# Patient Record
Sex: Female | Born: 1975 | Race: White | Hispanic: No | Marital: Married | Smoking: Current every day smoker
Health system: Southern US, Community
[De-identification: ages and names within clinical notes are randomized; demographics above are authoritative.]

## PROBLEM LIST (undated history)

## (undated) DIAGNOSIS — F32A Depression, unspecified: Secondary | ICD-10-CM

## (undated) DIAGNOSIS — O24419 Gestational diabetes mellitus in pregnancy, unspecified control: Secondary | ICD-10-CM

## (undated) DIAGNOSIS — G44229 Chronic tension-type headache, not intractable: Secondary | ICD-10-CM

## (undated) DIAGNOSIS — F329 Major depressive disorder, single episode, unspecified: Secondary | ICD-10-CM

## (undated) HISTORY — DX: Chronic tension-type headache, not intractable: G44.229

## (undated) HISTORY — DX: Gestational diabetes mellitus in pregnancy, unspecified control: O24.419

## (undated) HISTORY — DX: Depression, unspecified: F32.A

## (undated) HISTORY — PX: DILATION AND CURETTAGE OF UTERUS: SHX78

## (undated) HISTORY — DX: Major depressive disorder, single episode, unspecified: F32.9

---

## 1998-06-25 HISTORY — PX: EYE SURGERY: SHX253

## 2004-06-24 ENCOUNTER — Emergency Department (HOSPITAL_COMMUNITY): Admission: EM | Admit: 2004-06-24 | Discharge: 2004-06-24 | Payer: Self-pay | Admitting: Emergency Medicine

## 2004-06-26 ENCOUNTER — Emergency Department (HOSPITAL_COMMUNITY): Admission: EM | Admit: 2004-06-26 | Discharge: 2004-06-26 | Payer: Self-pay | Admitting: Emergency Medicine

## 2004-10-25 ENCOUNTER — Ambulatory Visit (HOSPITAL_COMMUNITY): Admission: RE | Admit: 2004-10-25 | Discharge: 2004-10-25 | Payer: Self-pay | Admitting: Obstetrics & Gynecology

## 2005-01-05 ENCOUNTER — Ambulatory Visit (HOSPITAL_COMMUNITY): Admission: RE | Admit: 2005-01-05 | Discharge: 2005-01-05 | Payer: Self-pay | Admitting: Obstetrics & Gynecology

## 2005-02-24 ENCOUNTER — Emergency Department (HOSPITAL_COMMUNITY): Admission: EM | Admit: 2005-02-24 | Discharge: 2005-02-24 | Payer: Self-pay | Admitting: Emergency Medicine

## 2005-03-24 ENCOUNTER — Inpatient Hospital Stay (HOSPITAL_COMMUNITY): Admission: AD | Admit: 2005-03-24 | Discharge: 2005-03-24 | Payer: Self-pay | Admitting: Obstetrics

## 2005-03-25 ENCOUNTER — Inpatient Hospital Stay (HOSPITAL_COMMUNITY): Admission: AD | Admit: 2005-03-25 | Discharge: 2005-03-25 | Payer: Self-pay | Admitting: Obstetrics

## 2005-03-25 ENCOUNTER — Inpatient Hospital Stay (HOSPITAL_COMMUNITY): Admission: AD | Admit: 2005-03-25 | Discharge: 2005-03-28 | Payer: Self-pay | Admitting: Obstetrics

## 2005-03-27 ENCOUNTER — Encounter (INDEPENDENT_AMBULATORY_CARE_PROVIDER_SITE_OTHER): Payer: Self-pay | Admitting: Specialist

## 2006-08-04 ENCOUNTER — Emergency Department (HOSPITAL_COMMUNITY): Admission: EM | Admit: 2006-08-04 | Discharge: 2006-08-04 | Payer: Self-pay | Admitting: Emergency Medicine

## 2006-09-17 ENCOUNTER — Encounter: Admission: RE | Admit: 2006-09-17 | Discharge: 2006-09-17 | Payer: Self-pay | Admitting: Obstetrics & Gynecology

## 2006-09-21 ENCOUNTER — Inpatient Hospital Stay (HOSPITAL_COMMUNITY): Admission: AD | Admit: 2006-09-21 | Discharge: 2006-09-21 | Payer: Self-pay | Admitting: Obstetrics & Gynecology

## 2006-10-16 ENCOUNTER — Inpatient Hospital Stay (HOSPITAL_COMMUNITY): Admission: AD | Admit: 2006-10-16 | Discharge: 2006-10-19 | Payer: Self-pay | Admitting: Obstetrics and Gynecology

## 2007-12-18 ENCOUNTER — Emergency Department (HOSPITAL_COMMUNITY): Admission: EM | Admit: 2007-12-18 | Discharge: 2007-12-18 | Payer: Self-pay | Admitting: Emergency Medicine

## 2007-12-26 ENCOUNTER — Emergency Department (HOSPITAL_COMMUNITY): Admission: EM | Admit: 2007-12-26 | Discharge: 2007-12-26 | Payer: Self-pay | Admitting: Emergency Medicine

## 2008-01-13 ENCOUNTER — Emergency Department (HOSPITAL_COMMUNITY): Admission: EM | Admit: 2008-01-13 | Discharge: 2008-01-13 | Payer: Self-pay | Admitting: Emergency Medicine

## 2008-04-08 ENCOUNTER — Emergency Department (HOSPITAL_COMMUNITY): Admission: EM | Admit: 2008-04-08 | Discharge: 2008-04-09 | Payer: Self-pay | Admitting: Emergency Medicine

## 2010-10-08 ENCOUNTER — Emergency Department (HOSPITAL_COMMUNITY)
Admission: EM | Admit: 2010-10-08 | Discharge: 2010-10-08 | Disposition: A | Discharge status: Home / Self Care | Payer: Self-pay | Attending: Emergency Medicine | Admitting: Emergency Medicine

## 2010-10-08 DIAGNOSIS — R3915 Urgency of urination: Secondary | ICD-10-CM | POA: Insufficient documentation

## 2010-10-08 DIAGNOSIS — R350 Frequency of micturition: Secondary | ICD-10-CM | POA: Insufficient documentation

## 2010-10-08 DIAGNOSIS — N390 Urinary tract infection, site not specified: Secondary | ICD-10-CM | POA: Insufficient documentation

## 2010-10-08 DIAGNOSIS — R300 Dysuria: Secondary | ICD-10-CM | POA: Insufficient documentation

## 2010-10-08 LAB — URINALYSIS, ROUTINE W REFLEX MICROSCOPIC
Hgb urine dipstick: NEGATIVE
Ketones, ur: NEGATIVE mg/dL
Nitrite: NEGATIVE
Protein, ur: NEGATIVE mg/dL
Specific Gravity, Urine: 1.022 (ref 1.005–1.030)
Urobilinogen, UA: 1 mg/dL (ref 0.0–1.0)
pH: 8 (ref 5.0–8.0)

## 2010-10-08 LAB — URINE MICROSCOPIC-ADD ON

## 2010-10-08 LAB — POCT PREGNANCY, URINE
Preg Test, Ur: NEGATIVE
Preg Test, Ur: NEGATIVE

## 2010-11-10 NOTE — H&P (Signed)
[name], Tanya Ingram           ACCOUNT NO.:  [id number]   MEDICAL RECORD NO.:  [id number]          PATIENT TYPE:  INP   LOCATION:  9166                          FACILITY:  [facility]   PHYSICIAN:  Roseanna Rainbow, M.D.DATE OF BIRTH:  [birth year]   DATE OF ADMISSION:  03/25/2005  DATE OF DISCHARGE:                                HISTORY & PHYSICAL   CHIEF COMPLAINT:  The patient is a 35 year old para 0 with an estimated date  of confinement of September 29, with an intrauterine pregnancy at 40-2/7ths  weeks, complaining of uterine contractions.   HISTORY OF PRESENT ILLNESS:  Please see the above.   ANTEPARTUM COURSE:  No known risk factors. Labs: Antibody screen negative.  Blood type O positive. Chlamydia negative. GC negative. GBS negative on  September 1. Hepatitis B surface antigen negative. Hematocrit 36, hemoglobin  11.7. Pap smear within normal limits. Platelets 306,000. Rubella immune.  Urine culture and sensitivity no growth. RPR nonreactive.   PAST GYNECOLOGIC HISTORY:  She has a history of voluntary termination of  pregnancy first trimester. No sexually transmitted infections. Pap smear  normal.   PAST MEDICAL HISTORY:  1.  Recurrent urinary tract infections.  2.  Pyelonephritis.  3.  Migraine headaches.  4.  Anxiety.  5.  Depression.  6.  Questionable diagnosis of bipolar disorder.   PAST SURGICAL HISTORY:  1.  D&C.  2.  Laser eye surgery.   SOCIAL HISTORY:  Unemployed, single. Not using alcohol. Previously smoked  1/2 pack per day, recently quit. Previously used marijuana.   FAMILY HISTORY:  Breast cancer, alcoholism, depression, diabetes mellitus,  heart disease, migraine headaches, myocardial infarction, sociopathic  tendencies.   ALLERGIES:  SULFA.   MEDICATIONS:  Prenatal vitamins.   PHYSICAL EXAMINATION:  VITAL SIGNS:  Temperature 98, pulse 85, respirations  18, blood pressure 123/72, fetal heart tracing reassuring. Tocodynamometer:  Uterine  contractions every 3-5 minutes.  GENERAL:  Uncomfortable  PELVIC:  Sterile vaginal exam per the R.N. 6 cm dilated, presenting part  high.   ASSESSMENT:  1.  Intrauterine pregnancy at term.  2.  Latent versus early active labor.  3.  Fetal heart tracing consistent with fetal well being.   PLAN:  Admission, expectant management.      Roseanna Rainbow, M.D.  Electronically Signed     LAJ/MEDQ  D:  03/26/2005  T:  03/26/2005  Job:  [id number]

## 2010-11-10 NOTE — H&P (Signed)
Tanya Ingram, Tanya Ingram           ACCOUNT NO.:  [id number]   MEDICAL RECORD NO.:  [id number]          PATIENT TYPE:  INP   LOCATION:                                FACILITY:  [facility]   PHYSICIAN:  Duke Salvia. Marcelle Overlie, M.D.DATE OF BIRTH:  07-Sep-1975   DATE OF ADMISSION:  10/17/2006  DATE OF DISCHARGE:  10/19/2006                              HISTORY & PHYSICAL   CHIEF COMPLAINT:  Labor.   HISTORY OF PRESENT ILLNESS:  A 35 year old gravida 1, para 0, EDD October 16, 2006.  She presents in labor.  Her prenatal course has been  uneventful.  Her blood type is A-positive.  Her rubella titer is immune.  A three-hour GTT returned normal.  GBS was negative.   PAST MEDICAL HISTORY:  Please see the [facility] form for details.   PHYSICAL EXAMINATION:  VITAL SIGNS:  Temperature 98.2 degrees, blood  pressure 120/78.  HEENT:  Unremarkable.  NECK:  Supple without masses.  LUNGS:  Clear.  CARDIOVASCULAR:  A regular rate and rhythm without murmurs, rubs or  gallops noted.  BREASTS:  Not examined.  ABDOMEN:  Term fundal height.  Fetal heart rate 140.  PELVIC:  Cervix on admission was 3, 50%, vertex.  Membranes intact.  EXTREMITIES:  Unremarkable.  NEUROLOGIC:  Unremarkable.   IMPRESSION:  Term intrauterine pregnancy, labor.   PLAN:  Anticipated vaginal delivery.      Richard M. Marcelle Overlie, M.D.  Electronically Signed     RMH/MEDQ  D:  12/24/2006  T:  12/24/2006  Job:  [id number]

## 2010-11-10 NOTE — Op Note (Signed)
NAMETAELYR, JANTZ           ACCOUNT NO.:  [id number]   MEDICAL RECORD NO.:  [id number]          PATIENT TYPE:  INP   LOCATION:  9134                          [facility]ACILITY:  [facility]   PHYSICIAN:  Roseanna Rainbow, M.D.DATE OF BIRTH:  1976/01/22   DATE OF PROCEDURE:  03/26/2005  DATE OF DISCHARGE:                                 OPERATIVE REPORT   The patient was fully dilated and pushing.  After slow crowning and delivery  of the head, there was difficulty delivering the anterior shoulder.  The  head restituted to ROA.  At this point, suprapubic pressure and McRoberts  positioning were performed in attempts to dislodge the anterior shoulder;  however, these attempts were unsuccessful.  An attempt was made at a Rubin  maneuver.  A modified woodscrew maneuver was then performed.  This was then  followed by an attempt to deliver the  posterior arm.  These maneuvers were  then repeated; the posterior arm was then ultimately delivered.  The  remainder of the body was delivered.  The cord was clamped and cut.  The  neonatologist was called.  The infant was transferred to the warmer.  An  umbilical artery pH was 7.30.  The time from delivery of the head to the  delivery of the shoulders was approximately 4 minutes.  The placenta was  then delivered intact with a three-vessel cord.  There were multiple small  labial lacerations and vaginal lacerations that were repaired with  interrupted sutures of 2-0 and 3-0 Vicryl Rapide.  The estimated blood loss  was approximately 500 mL.  The mother and infant were in stable condition.      Roseanna Rainbow, M.D.  Electronically Signed     LAJ/MEDQ  D:  03/26/2005  T:  03/26/2005  Job:  [id number]

## 2011-03-16 ENCOUNTER — Emergency Department (HOSPITAL_BASED_OUTPATIENT_CLINIC_OR_DEPARTMENT_OTHER)
Admission: EM | Admit: 2011-03-16 | Discharge: 2011-03-16 | Disposition: A | Discharge status: Home / Self Care | Payer: Self-pay | Attending: Emergency Medicine | Admitting: Emergency Medicine

## 2011-03-16 DIAGNOSIS — T622X1A Toxic effect of other ingested (parts of) plant(s), accidental (unintentional), initial encounter: Secondary | ICD-10-CM | POA: Insufficient documentation

## 2011-03-16 DIAGNOSIS — L237 Allergic contact dermatitis due to plants, except food: Secondary | ICD-10-CM

## 2011-03-16 DIAGNOSIS — F17200 Nicotine dependence, unspecified, uncomplicated: Secondary | ICD-10-CM | POA: Insufficient documentation

## 2011-03-16 DIAGNOSIS — L255 Unspecified contact dermatitis due to plants, except food: Secondary | ICD-10-CM | POA: Insufficient documentation

## 2011-03-16 MED ORDER — PREDNISONE 10 MG PO TABS: ORAL_TABLET | ORAL | Status: DC

## 2011-03-16 NOTE — ED Provider Notes (Signed)
History     CSN: [id number] Arrival date & time: 03/16/2011 12:43 PM  Chief Complaint  Patient presents with  . Poison Ivy    HPI  (Consider location/radiation/quality/duration/timing/severity/associated sxs/prior treatment)  Patient is a 35 y.o. female presenting with Poison Ivy. The history is provided by the patient. No language interpreter was used.  Poison Lajoyce Corners This is a new problem. The current episode started in the past 7 days. The problem occurs constantly. The problem has been unchanged. Associated symptoms include a rash. The symptoms are aggravated by nothing. She has tried nothing for the symptoms. The treatment provided moderate relief.    History reviewed. No pertinent past medical history.  Past Surgical History  Procedure Date  . Dilation and curettage of uterus     No family history on file.  History  Substance Use Topics  . Smoking status: Current Everyday Smoker  . Smokeless tobacco: Not on file  . Alcohol Use: Yes    OB History    Grav Para Term Preterm Abortions TAB SAB Ect Mult Living                  Review of Systems  Review of Systems  Skin: Positive for rash.  All other systems reviewed and are negative.    Allergies  Percocet  Home Medications  No current outpatient prescriptions on file.  Physical Exam    BP 107/67  Pulse 67  Temp(Src) 98.3 F (36.8 C) (Oral)  Ht 5\' 7"  (1.702 m)  Wt 170 lb (77.111 kg)  BMI 26.63 kg/m2  SpO2 100%  LMP 02/11/2011  Physical Exam  Nursing note and vitals reviewed. Constitutional: She is oriented to person, place, and time. She appears well-developed and well-nourished.  HENT:  Head: Normocephalic and atraumatic.  Eyes: Conjunctivae and EOM are normal. Pupils are equal, round, and reactive to light.  Neck: Normal range of motion. Neck supple.  Pulmonary/Chest: Effort normal.  Musculoskeletal: Normal range of motion.  Neurological: She is alert and oriented to person, place, and time. She  has normal reflexes.  Skin: Rash noted.  Psychiatric: She has a normal mood and affect.  red, raised linear rash,  Looks like poison ivy  ED Course  Procedures (including critical care time)  Labs Reviewed - No data to display No results found.   No diagnosis found.   MDM Pt counseled on poison ivy,         Langston Masker, [place] 03/16/11 1348

## 2011-03-16 NOTE — ED Notes (Signed)
C/o poison ivy rash x 6 days-swelling to bilat neck lymph nodes yesterday

## 2011-03-22 LAB — RAPID STREP SCREEN (MED CTR MEBANE ONLY): Streptococcus, Group A Screen (Direct): NEGATIVE

## 2011-03-23 LAB — POCT RAPID STREP A: Streptococcus, Group A Screen (Direct): NEGATIVE

## 2011-03-23 NOTE — ED Provider Notes (Signed)
Medical screening examination/treatment/procedure(s) were performed by non-physician practitioner and as supervising physician I was immediately available for consultation/collaboration.   Culley Hedeen, MD 03/23/11 0542 

## 2011-03-27 LAB — URINALYSIS, ROUTINE W REFLEX MICROSCOPIC
Glucose, UA: NEGATIVE
Hgb urine dipstick: NEGATIVE
Ketones, ur: >80 — AB
Nitrite: NEGATIVE
Protein, ur: 30 — AB
Specific Gravity, Urine: 1.037 — ABNORMAL HIGH
Urobilinogen, UA: 0.2
pH: 5.5

## 2011-03-27 LAB — POCT I-STAT, CHEM 8
BUN: 18
Calcium, Ion: 1.15
Chloride: 109
Creatinine, Ser: 0.7
Glucose, Bld: 112 — ABNORMAL HIGH
HCT: 46
Hemoglobin: 15.6 — ABNORMAL HIGH
Potassium: 4.1
Sodium: 139
TCO2: 21

## 2011-03-27 LAB — URINE MICROSCOPIC-ADD ON

## 2011-03-27 LAB — POCT PREGNANCY, URINE: Preg Test, Ur: NEGATIVE

## 2011-04-04 ENCOUNTER — Emergency Department (HOSPITAL_COMMUNITY): Payer: Self-pay

## 2011-04-04 ENCOUNTER — Emergency Department (HOSPITAL_COMMUNITY)
Admission: EM | Admit: 2011-04-04 | Discharge: 2011-04-04 | Disposition: A | Discharge status: Home / Self Care | Payer: No Typology Code available for payment source | Attending: Emergency Medicine | Admitting: Emergency Medicine

## 2011-04-04 DIAGNOSIS — S139XXA Sprain of joints and ligaments of unspecified parts of neck, initial encounter: Secondary | ICD-10-CM | POA: Insufficient documentation

## 2011-04-04 DIAGNOSIS — M542 Cervicalgia: Secondary | ICD-10-CM | POA: Insufficient documentation

## 2011-04-04 DIAGNOSIS — M25529 Pain in unspecified elbow: Secondary | ICD-10-CM | POA: Insufficient documentation

## 2011-04-04 DIAGNOSIS — M25539 Pain in unspecified wrist: Secondary | ICD-10-CM | POA: Insufficient documentation

## 2011-04-04 DIAGNOSIS — R079 Chest pain, unspecified: Secondary | ICD-10-CM | POA: Insufficient documentation

## 2011-06-26 ENCOUNTER — Encounter (HOSPITAL_BASED_OUTPATIENT_CLINIC_OR_DEPARTMENT_OTHER): Payer: Self-pay | Admitting: Emergency Medicine

## 2011-06-26 ENCOUNTER — Emergency Department (HOSPITAL_BASED_OUTPATIENT_CLINIC_OR_DEPARTMENT_OTHER)
Admission: EM | Admit: 2011-06-26 | Discharge: 2011-06-26 | Disposition: A | Discharge status: Home / Self Care | Payer: Self-pay | Attending: Emergency Medicine | Admitting: Emergency Medicine

## 2011-06-26 DIAGNOSIS — F17200 Nicotine dependence, unspecified, uncomplicated: Secondary | ICD-10-CM | POA: Insufficient documentation

## 2011-06-26 DIAGNOSIS — R221 Localized swelling, mass and lump, neck: Secondary | ICD-10-CM | POA: Insufficient documentation

## 2011-06-26 DIAGNOSIS — K047 Periapical abscess without sinus: Secondary | ICD-10-CM | POA: Insufficient documentation

## 2011-06-26 DIAGNOSIS — R220 Localized swelling, mass and lump, head: Secondary | ICD-10-CM | POA: Insufficient documentation

## 2011-06-26 MED ORDER — CLINDAMYCIN HCL 150 MG PO CAPS: 150.0000 mg | ORAL_CAPSULE | Freq: Four times a day (QID) | ORAL | Status: AC

## 2011-06-26 MED ORDER — HYDROCODONE-ACETAMINOPHEN 5-500 MG PO TABS: 1.0000 | ORAL_TABLET | Freq: Four times a day (QID) | ORAL | Status: AC | PRN

## 2011-06-26 NOTE — ED Notes (Signed)
Pt has broken tooth right upper area.  Noted some facial swelling in same area.  Pt also c/o sinus congestion.  Some clear to yellow-congestion, drainage.  No known fever.

## 2011-06-26 NOTE — ED Provider Notes (Signed)
History     CSN: [id number]  Arrival date & time 06/26/11  1428   First MD Initiated Contact with Patient 06/26/11 1529      Chief Complaint  Patient presents with  . Facial Swelling  . Dental Problem  . Sinusitis    (Consider location/radiation/quality/duration/timing/severity/associated sxs/prior treatment) HPI Comments: Pt states that she cracked a tooth a while ago and then developed swelling recently:pt also c/o sinus pressure on the right side and some facial swelling  Patient is a 36 y.o. female presenting with tooth pain. The history is provided by the patient. No language interpreter was used.  Dental PainThe primary symptoms include mouth pain. Primary symptoms do not include dental injury, fever or sore throat. The symptoms began 3 to 5 days ago. The symptoms are worsening. The symptoms occur constantly.  Additional symptoms include: facial swelling. Additional symptoms do not include: gum swelling.    History reviewed. No pertinent past medical history.  Past Surgical History  Procedure Date  . Dilation and curettage of uterus     History reviewed. No pertinent family history.  History  Substance Use Topics  . Smoking status: Current Everyday Smoker  . Smokeless tobacco: Not on file  . Alcohol Use: Yes    OB History    Grav Para Term Preterm Abortions TAB SAB Ect Mult Living                  Review of Systems  Constitutional: Negative for fever.  HENT: Positive for facial swelling. Negative for sore throat.   Eyes: Negative.   Respiratory: Negative.   Cardiovascular: Negative.     Allergies  Nickel and Percocet  Home Medications   Current Outpatient Rx  Name Route Sig Dispense Refill  . AMOXICILLIN PO Oral Take 10 mLs by mouth 2 (two) times daily.      . IBUPROFEN 200 MG PO TABS Oral Take 600 mg by mouth every 6 (six) hours as needed. For pain      . CLINDAMYCIN HCL 150 MG PO CAPS Oral Take 1 capsule (150 mg total) by mouth every 6 (six)  hours. 28 capsule 0  . HYDROCODONE-ACETAMINOPHEN 5-500 MG PO TABS Oral Take 1-2 tablets by mouth every 6 (six) hours as needed for pain. 10 tablet 0  . LEVONORGESTREL 20 MCG/24HR IU IUD Intrauterine 1 each by Intrauterine route once. Inserted May 2008       BP 106/70  Pulse 87  Temp(Src) 98.8 F (37.1 C) (Oral)  Resp 16  SpO2 100%  LMP 06/05/2011  Physical Exam  Nursing note and vitals reviewed. Constitutional: She appears well-developed and well-nourished.  HENT:  Right Ear: External ear normal.  Left Ear: External ear normal.       Pt has multiple decayed teeth:swelling noted to the right upper cheek  Neck: Neck supple.  Cardiovascular: Normal rate and regular rhythm.   Pulmonary/Chest: Breath sounds normal.    ED Course  Procedures (including critical care time)  Labs Reviewed - No data to display No results found.   1. Dental abscess       MDM  Will treat and gave pt referral        Teressa Lower, NP 06/26/11 1549

## 2011-06-26 NOTE — ED Provider Notes (Signed)
Medical screening examination/treatment/procedure(s) were performed by non-physician practitioner and as supervising physician I was immediately available for consultation/collaboration.   Leigh-Ann Hassen Bruun, MD 06/26/11 1619 

## 2012-01-26 ENCOUNTER — Emergency Department (HOSPITAL_COMMUNITY): Payer: Self-pay

## 2012-01-26 ENCOUNTER — Encounter (HOSPITAL_COMMUNITY): Payer: Self-pay | Admitting: Emergency Medicine

## 2012-01-26 ENCOUNTER — Emergency Department (HOSPITAL_COMMUNITY)
Admission: EM | Admit: 2012-01-26 | Discharge: 2012-01-26 | Disposition: A | Discharge status: Home / Self Care | Payer: Self-pay | Attending: Emergency Medicine | Admitting: Emergency Medicine

## 2012-01-26 DIAGNOSIS — S93402A Sprain of unspecified ligament of left ankle, initial encounter: Secondary | ICD-10-CM

## 2012-01-26 DIAGNOSIS — S93409A Sprain of unspecified ligament of unspecified ankle, initial encounter: Secondary | ICD-10-CM | POA: Insufficient documentation

## 2012-01-26 DIAGNOSIS — F17200 Nicotine dependence, unspecified, uncomplicated: Secondary | ICD-10-CM | POA: Insufficient documentation

## 2012-01-26 DIAGNOSIS — W19XXXA Unspecified fall, initial encounter: Secondary | ICD-10-CM | POA: Insufficient documentation

## 2012-01-26 MED ORDER — HYDROCODONE-ACETAMINOPHEN 5-325 MG PO TABS
2.0000 | ORAL_TABLET | Freq: Once | ORAL | Status: AC
  Administered 2012-01-26: 2 via ORAL
  Filled 2012-01-26: qty 2

## 2012-01-26 MED ORDER — HYDROCODONE-ACETAMINOPHEN 5-325 MG PO TABS: 2.0000 | ORAL_TABLET | Freq: Four times a day (QID) | ORAL | Status: AC | PRN

## 2012-01-26 NOTE — ED Notes (Signed)
Pt reports while walking in her backyard, slip and heard a pop in her left ankle. Pt reports husband told her that her eyes were glazed and she was unresponsive for a moment. Pt AAOx4 at present.

## 2012-01-26 NOTE — ED Notes (Signed)
Patient transported to X-ray 

## 2012-01-26 NOTE — ED Notes (Signed)
Returned from xray

## 2012-01-26 NOTE — ED Provider Notes (Signed)
History     CSN: [id number]  Arrival date & time 01/26/12  1232   First MD Initiated Contact with Patient 01/26/12 1244      Chief Complaint  Patient presents with  . Fall  . Ankle Pain    left    (Consider location/radiation/quality/duration/timing/severity/associated sxs/prior treatment) HPI This 36 year old female accidentally twisted her left ankle and fell just prior to arrival with left lateral ankle pain only nonradiating in moderately severe it was no treatment prior to arrival. There is no swelling or deformity but she has localized left ankle tenderness laterally. She is able to walk with a limp. When she fell she was initially stunned and there is questionable brief syncope but the patient states she was actually awake the whole time she just did not want to move it she had no syncope and no amnesia for the event. There is no head or neck injury. She is no headache neck pain back pain chest pain shortness breath or abdominal pain. She is no injury to her arms or right leg. She is no weakness or numbness. She is left lateral ankle pain only. There is no foot pain. There is no treatment prior to arrival. History reviewed. No pertinent past medical history.  Past Surgical History  Procedure Date  . Dilation and curettage of uterus   . Eye surgery     No family history on file.  History  Substance Use Topics  . Smoking status: Current Everyday Smoker  . Smokeless tobacco: Not on file  . Alcohol Use: Yes    OB History    Grav Para Term Preterm Abortions TAB SAB Ect Mult Living                  Review of Systems 10 Systems reviewed and are negative for acute change except as noted in the HPI. Allergies  Nickel and Percocet  Home Medications   Current Outpatient Rx  Name Route Sig Dispense Refill  . LEVONORGESTREL 20 MCG/24HR IU IUD Intrauterine 1 each by Intrauterine route once. Inserted May 2008     . HYDROCODONE-ACETAMINOPHEN 5-325 MG PO TABS Oral Take 2  tablets by mouth every 6 (six) hours as needed for pain. 10 tablet 0  . IBUPROFEN 200 MG PO TABS Oral Take 600 mg by mouth every 6 (six) hours as needed. For pain        BP 98/58  Pulse 96  Temp 98.7 F (37.1 C) (Oral)  Resp 22  SpO2 95%  Physical Exam  Nursing note and vitals reviewed. Constitutional:       Awake, alert, nontoxic appearance.  HENT:  Head: Atraumatic.  Eyes: Right eye exhibits no discharge. Left eye exhibits no discharge.  Neck: Neck supple.  Pulmonary/Chest: Effort normal. She exhibits no tenderness.  Abdominal: Soft. There is no tenderness. There is no rebound.  Musculoskeletal: She exhibits tenderness. She exhibits no edema.       Baseline ROM, no obvious new focal weakness.  Both arms and right leg nontender. Left leg is nontender at the hip knee proximal fibula and calf and Achilles. Left foot is nontender. Left foot has capillary refill less than 2 seconds and normal light touch. Left ankle is tender laterally only with no obvious deformity or swelling. There is no medial ankle tenderness. There is no midfoot tenderness.  Neurological: She is alert.       Mental status and motor strength appears baseline for patient and situation.  Skin: No rash noted.  Psychiatric: She has a normal mood and affect.    ED Course  Procedures (including critical care time)  Labs Reviewed - No data to display No results found.   1. Left ankle sprain       MDM    Patient / Family / Caregiver informed of clinical course, understand medical decision-making process, and agree with plan.         Hurman Horn, MD 02/02/12 [phone]

## 2012-01-26 NOTE — Progress Notes (Signed)
Orthopedic Tech Progress Note Patient Details:  Tanya Ingram [birth year] [id number]  Ortho Devices Type of Ortho Device: ASO;Crutches Ortho Device/Splint Location: left ankle Ortho Device/Splint Interventions: Application   Tanya Ingram 01/26/2012, 1:43 PM

## 2013-01-24 ENCOUNTER — Emergency Department (HOSPITAL_COMMUNITY)
Admission: EM | Admit: 2013-01-24 | Discharge: 2013-01-24 | Disposition: A | Discharge status: Home / Self Care | Payer: Medicaid Other | Attending: Emergency Medicine | Admitting: Emergency Medicine

## 2013-01-24 ENCOUNTER — Encounter (HOSPITAL_COMMUNITY): Payer: Self-pay | Admitting: Physical Medicine and Rehabilitation

## 2013-01-24 DIAGNOSIS — J3489 Other specified disorders of nose and nasal sinuses: Secondary | ICD-10-CM | POA: Insufficient documentation

## 2013-01-24 DIAGNOSIS — F17200 Nicotine dependence, unspecified, uncomplicated: Secondary | ICD-10-CM | POA: Insufficient documentation

## 2013-01-24 DIAGNOSIS — R040 Epistaxis: Secondary | ICD-10-CM | POA: Insufficient documentation

## 2013-01-24 DIAGNOSIS — K089 Disorder of teeth and supporting structures, unspecified: Secondary | ICD-10-CM | POA: Insufficient documentation

## 2013-01-24 DIAGNOSIS — K0889 Other specified disorders of teeth and supporting structures: Secondary | ICD-10-CM

## 2013-01-24 MED ORDER — PENICILLIN V POTASSIUM 500 MG PO TABS: 500.0000 mg | ORAL_TABLET | Freq: Four times a day (QID) | ORAL | Status: DC

## 2013-01-24 MED ORDER — HYDROCODONE-ACETAMINOPHEN 5-325 MG PO TABS: 1.0000 | ORAL_TABLET | ORAL | Status: DC | PRN

## 2013-01-24 NOTE — ED Provider Notes (Signed)
CSN: [id number]     Arrival date & time 01/24/13  1715 History  This chart was scribed for non-physician practitioner Trixie Dredge, PA-C, working with Gerhard Munch, MD, by Yevette Edwards, ED Scribe. This patient was seen in room TR08C/TR08C and the patient's care was started at 5:33 PM.   First MD Initiated Contact with Patient 01/24/13 1731     Chief Complaint  Patient presents with  . Dental Pain    The history is provided by the patient. No language interpreter was used.   HPI Comments:  Tanya Ingram is a 37 y.o. female who presents to the Emergency Department complaining of upper right molar pain which increased two days ago. The pt has experienced swelling, pain, and pressure to the affected area. She states the pain is "throbbing" and is "not excruciating." The pt ranks the pain as a 6/10. She reports having a "crumbled" tooth which needs dental work, but she has had difficulty obtaining a dental appointment. She states the tooth has been problematic for about two years. She has experienced a dental abscess before, and she says that her current symptoms are similar to the prior abscess. She has treated her symptoms with Excedrin with some resolution.The pt denies a fever, emesis, nausea, appetite changes, difficulty swallowing, or SOB.   The pt did experience epistaxis from her right nare yesterday, and she reports that epistaxis is unusual for her. This was a trickle that lasted a few minutes and resolved spontaneously. She does report mild nasal congestion.   No past medical history on file. Past Surgical History  Procedure Laterality Date  . Dilation and curettage of uterus    . Eye surgery     No family history on file. History  Substance Use Topics  . Smoking status: Current Every Day Smoker  . Smokeless tobacco: Not on file  . Alcohol Use: Yes     Comment: socially   No OB history provided.  Review of Systems  Constitutional: Negative for fever and appetite change.   HENT: Positive for nosebleeds, congestion and dental problem. Negative for trouble swallowing.   Respiratory: Negative for shortness of breath.   Gastrointestinal: Negative for nausea and vomiting.    Allergies  Nickel and Percocet  Home Medications   Current Outpatient Rx  Name  Route  Sig  Dispense  Refill  . ibuprofen (ADVIL,MOTRIN) 200 MG tablet   Oral   Take 600 mg by mouth every 6 (six) hours as needed. For pain           . levonorgestrel (MIRENA) 20 MCG/24HR IUD   Intrauterine   1 each by Intrauterine route once. Inserted May 2008           Triage Vitals: BP 119/79  Pulse 86  Temp(Src) 98.6 F (37 C) (Oral)  Resp 18  SpO2 96%  Physical Exam  Nursing note and vitals reviewed. Constitutional: She appears well-developed and well-nourished. No distress.  HENT:  Head: Normocephalic and atraumatic.  Mouth/Throat: Oropharynx is clear and moist. No oropharyngeal exudate.    Right facial tenderness superior to affected tooth  Neck: Neck supple.  Pulmonary/Chest: Effort normal and breath sounds normal. No stridor. She has no decreased breath sounds. She has no wheezes. She has no rhonchi. She has no rales.  Lymphadenopathy:    She has no cervical adenopathy.  Neurological: She is alert.  Skin: She is not diaphoretic.    ED Course   DIAGNOSTIC STUDIES: Oxygen Saturation is 96% on room  air, normal by my interpretation.    COORDINATION OF CARE:  5:37 PM- Discussed treatment plan with patient which includes antibiotics, and the patient agreed to the plan.   Procedures (including critical care time)  Labs Reviewed - No data to display No results found. 1. Pain, dental     MDM  Pt with dental pain over one severely decayed tooth with subjective facial swelling but with localized tenderness over maxilla related to this tooth.  Possible dental abscess.  D/C with [facility], small amount of norco as pain mostly controlled with OTC meds, dental follow up.  Discussed   findings, treatment, follow up  with patient.  Pt given return precautions.  Pt verbalizes understanding and agrees with plan.     I doubt any other EMC precluding discharge at this time including, but not necessarily limited to the following: ludwig's angina, deep space head or neck infection  I personally performed the services described in this documentation, which was scribed in my presence. The recorded information has been reviewed and is accurate.    Trixie Dredge, PA-C 01/24/13 2116

## 2013-01-24 NOTE — ED Provider Notes (Signed)
  Medical screening examination/treatment/procedure(s) were performed by non-physician practitioner and as supervising physician I was immediately available for consultation/collaboration.    Gerhard Munch, MD 01/24/13 2358

## 2013-01-24 NOTE — ED Notes (Signed)
Pt presents to department for evaluation of R upper molar pain. Ongoing x2 days. 8/10 pain at the time. Respirations unlabored. Pt is alert and oriented x4.

## 2013-01-24 NOTE — ED Notes (Signed)
Patient To [facility] ED with C/O having a dental abscess.  States that the pain began 2 days ago and has worsened.  The right side of her face is noted to be swollen. Pain is in patient's right upper jaw.  RN noted that the patient's 3rd molar is missing but the roots are still visible at the gum line. Site is painful to touch.

## 2013-04-30 ENCOUNTER — Other Ambulatory Visit: Payer: Self-pay

## 2015-02-18 ENCOUNTER — Ambulatory Visit (INDEPENDENT_AMBULATORY_CARE_PROVIDER_SITE_OTHER): Payer: BLUE CROSS/BLUE SHIELD | Admitting: Emergency Medicine

## 2015-02-18 VITALS — BP 100/74 | HR 91 | Temp 98.7°F | Resp 18 | Ht 68.0 in | Wt 207.0 lb

## 2015-02-18 DIAGNOSIS — M5489 Other dorsalgia: Secondary | ICD-10-CM | POA: Diagnosis not present

## 2015-02-18 DIAGNOSIS — H00023 Hordeolum internum right eye, unspecified eyelid: Secondary | ICD-10-CM

## 2015-02-18 DIAGNOSIS — Z0000 Encounter for general adult medical examination without abnormal findings: Secondary | ICD-10-CM | POA: Diagnosis not present

## 2015-02-18 DIAGNOSIS — Z3009 Encounter for other general counseling and advice on contraception: Secondary | ICD-10-CM | POA: Diagnosis not present

## 2015-02-18 LAB — COMPLETE METABOLIC PANEL WITH GFR
ALBUMIN: 4.6 g/dL (ref 3.6–5.1)
ALT: 10 U/L (ref 6–29)
AST: 13 U/L (ref 10–30)
Alkaline Phosphatase: 73 U/L (ref 33–115)
BUN: 12 mg/dL (ref 7–25)
CALCIUM: 9.1 mg/dL (ref 8.6–10.2)
CO2: 21 mmol/L (ref 20–31)
Chloride: 105 mmol/L (ref 98–110)
Creat: 0.78 mg/dL (ref 0.50–1.10)
GFR, Est African American: >89 mL/min (ref >=60)
GFR, Est Non African American: >89 mL/min (ref >=60)
Glucose, Bld: 67 mg/dL (ref 65–99)
POTASSIUM: 4.2 mmol/L (ref 3.5–5.3)
Sodium: 139 mmol/L (ref 135–146)
Total Bilirubin: 0.5 mg/dL (ref 0.2–1.2)
Total Protein: 6.5 g/dL (ref 6.1–8.1)

## 2015-02-18 LAB — POCT WET PREP WITH KOH
Clue Cells Wet Prep HPF POC: NEGATIVE
KOH PREP POC: POSITIVE
TRICHOMONAS UA: NEGATIVE
YEAST WET PREP PER HPF POC: NEGATIVE

## 2015-02-18 LAB — POCT URINALYSIS DIPSTICK
BILIRUBIN UA: NEGATIVE
Blood, UA: NEGATIVE
Glucose, UA: NEGATIVE
KETONES UA: NEGATIVE
LEUKOCYTES UA: NEGATIVE
Nitrite, UA: NEGATIVE
PH UA: 6.5
Protein, UA: NEGATIVE
Spec Grav, UA: 1.015
Urobilinogen, UA: 0.2

## 2015-02-18 LAB — POCT CBC
Granulocyte percent: 66.4 %G (ref 37–80)
HCT, POC: 40.6 % (ref 37.7–47.9)
Hemoglobin: 12.2 g/dL (ref 12.2–16.2)
Lymph, poc: 2.3 (ref 0.6–3.4)
MCH, POC: 26.3 pg — AB (ref 27–31.2)
MCHC: 30 g/dL — AB (ref 31.8–35.4)
MCV: 87.5 fL (ref 80–97)
MID (CBC): 0.3 (ref 0–0.9)
MPV: 8.4 fL (ref 0–99.8)
POC Granulocyte: 5.2 (ref 2–6.9)
POC LYMPH %: 29.8 % (ref 10–50)
POC MID %: 3.8 %M (ref 0–12)
Platelet Count, POC: 253 10*3/uL (ref 142–424)
RBC: 4.64 M/uL (ref 4.04–5.48)
RDW, POC: 13.5 %
WBC: 7.8 10*3/uL (ref 4.6–10.2)

## 2015-02-18 LAB — LIPID PANEL
CHOLESTEROL: 152 mg/dL (ref 125–200)
HDL: 36 mg/dL — ABNORMAL LOW (ref >=46)
LDL Cholesterol: 84 mg/dL (ref <130)
Total CHOL/HDL Ratio: 4.2 Ratio (ref <=5.0)
Triglycerides: 161 mg/dL — ABNORMAL HIGH (ref <150)
VLDL: 32 mg/dL — ABNORMAL HIGH (ref <30)

## 2015-02-18 LAB — POCT URINE PREGNANCY: Preg Test, Ur: NEGATIVE

## 2015-02-18 MED ORDER — ERYTHROMYCIN 5 MG/GM OP OINT: 1.0000 "application " | TOPICAL_OINTMENT | Freq: Three times a day (TID) | OPHTHALMIC | Status: DC

## 2015-02-18 NOTE — Patient Instructions (Signed)

## 2015-02-18 NOTE — Progress Notes (Addendum)
Patient ID: Tanya Ingram, female   DOB: 26-May-1976, 39 y.o.   MRN: [id number]    This chart was scribed for Tanya Lites, MD by [facility], medical scribe at [facility].The patient was seen in exam room 05 and the patient's care was started at 1:39 PM.  Chief Complaint:  Chief Complaint  Patient presents with  . Annual Exam    with pap   . Blurred Vision    bump on eye ball noticed this morning    HPI: Tanya Ingram is a 39 y.o. female who reports to Tanya Ingram today for a complete physical exam.  She has a bump on the right eye onset two days ago with associated blurry vision. Complains of seasonal allergies, every morning she is congested, and coughs phlegm. Complains of numbness in both great toes. Complains of chronic headaches. Has had a Mirena for three years, she would like another Mirena.  Two children 56,29 years old. Family history of diabetes and depression, brother is diabetic. Vaping instead of smoking for 16 months, no illicit drug use or alcohol. Swims, and yoga for exercise.   Past Medical History  Diagnosis Date  . Depression   . Diabetes mellitus without complication    Past Surgical History  Procedure Laterality Date  . Dilation and curettage of uterus    . Eye surgery     Social History   Social History  . Marital Status: Single    Spouse Name: N/A  . Number of Children: N/A  . Years of Education: N/A   Social History Main Topics  . Smoking status: Former Games developer  . Smokeless tobacco: None     Comment: vapor  . Alcohol Use: 0.6 oz/week    1 Standard drinks or equivalent per week     Comment: socially  . Drug Use: No  . Sexual Activity: Yes    Birth Control/ Protection: IUD   Other Topics Concern  . None   Social History Narrative   Family History  Problem Relation Age of Onset  . Stroke Paternal Grandmother   . Diabetes Brother    Allergies  Allergen Reactions  . Nickel Rash  . Percocet [Oxycodone-Acetaminophen] Rash     Prior to Admission medications   Medication Sig Start Date End Date Taking? Authorizing Provider  levonorgestrel (MIRENA) 20 MCG/24HR IUD 1 each by Intrauterine route once. Inserted May 2008    Yes Historical Provider, MD  HYDROcodone-acetaminophen (NORCO/VICODIN) 5-325 MG per tablet Take 1 tablet by mouth every 4 (four) hours as needed for pain (for breakthrough pain). Patient not taking: Reported on 02/18/2015 01/24/13   Trixie Dredge, PA-C  ibuprofen (ADVIL,MOTRIN) 200 MG tablet Take 600 mg by mouth every 6 (six) hours as needed. For pain      Historical Provider, MD  penicillin v potassium (VEETID) 500 MG tablet Take 1 tablet (500 mg total) by mouth 4 (four) times daily. Patient not taking: Reported on 02/18/2015 01/24/13   Trixie Dredge, PA-C    ROS: The patient denies fevers, chills, night sweats, unintentional weight loss, chest pain, palpitations, wheezing, dyspnea on exertion, nausea, vomiting, abdominal pain, dysuria, hematuria, melena, numbness, weakness, or tingling.  All other systems have been reviewed and were otherwise negative with the exception of those mentioned in the HPI and as above.    PHYSICAL EXAM: Filed Vitals:   02/18/15 1257  BP: 100/74  Pulse: 91  Temp: 98.7 F (37.1 C)  Resp: 18   Body  mass index is 31.48 kg/(m^2).  General: Alert, no acute distress HEENT:  Normocephalic, atraumatic, oropharynx patent. Eye: Nonie Hoyer [facility] Cardiovascular:  Regular rate and rhythm, no rubs murmurs or gallops.  No Carotid bruits, radial pulse intact. No pedal edema.  Respiratory: Clear to auscultation bilaterally.  No wheezes, rales, or rhonchi.  No cyanosis, no use of accessory musculature Abdominal: No organomegaly, abdomen is soft and non-tender, positive bowel sounds. No masses. Musculoskeletal: Gait intact. No edema, tenderness Skin: No rashes. Neurologic: Facial musculature symmetric. Psychiatric: Patient acts appropriately throughout our interaction. Lymphatic: No cervical  or submandibular lymphadenopathy Genitourinary/Anorectal: No acute findings, string is palpable.   LABS: Results for orders placed or performed in visit on 02/18/15  POCT CBC  Result Value Ref Range   WBC 7.8 4.6 - 10.2 K/uL   Lymph, poc 2.3 0.6 - 3.4   POC LYMPH PERCENT 29.8 10 - 50 %L   MID (cbc) 0.3 0 - 0.9   POC MID % 3.8 0 - 12 %M   POC Granulocyte 5.2 2 - 6.9   Granulocyte percent 66.4 37 - 80 %G   RBC 4.64 4.04 - 5.48 M/uL   Hemoglobin 12.2 12.2 - 16.2 g/dL   HCT, POC 16.1 09.6 - 47.9 %   MCV 87.5 80 - 97 fL   MCH, POC 26.3 (A) 27 - 31.2 pg   MCHC 30.0 (A) 31.8 - 35.4 g/dL   RDW, POC 04.5 %   Platelet Count, POC 253 142 - 424 K/uL   MPV 8.4 0 - 99.8 fL  POCT urine pregnancy  Result Value Ref Range   Preg Test, Ur Negative Negative  POCT Wet Prep with KOH  Result Value Ref Range   Trichomonas, UA Negative    Clue Cells Wet Prep HPF POC neg    Epithelial Wet Prep HPF POC Moderate Few, Moderate, Many   Yeast Wet Prep HPF POC neg    Bacteria Wet Prep HPF POC Moderate (A) None, Few   RBC Wet Prep HPF POC 0-2    WBC Wet Prep HPF POC 12-14    KOH Prep POC Positive   POCT urinalysis dipstick  Result Value Ref Range   Color, UA yellow    Clarity, UA clear    Glucose, UA neg    Bilirubin, UA neg    Ketones, UA neg    Spec Grav, UA 1.015    Blood, UA neg    pH, UA 6.5    Protein, UA neg    Urobilinogen, UA 0.2    Nitrite, UA neg    Leukocytes, UA Negative Negative   EKG/XRAY:   Primary read interpreted by Dr. Cleta Alberts at [facility].  ASSESSMENT/PLAN: 1. Annual physical exam  - POCT CBC - COMPLETE METABOLIC PANEL WITH GFR - Lipid panel - HIV antibody - Hepatitis C antibody - Pap IG, CT/NG NAA, and HPV (high risk) - POCT urine pregnancy - POCT Wet Prep with KOH - POCT urinalysis dipstick  2. Encounter for other general counseling or advice on contraception  - POCT CBC - COMPLETE METABOLIC PANEL WITH GFR - Lipid panel - HIV antibody - Hepatitis C antibody -  Pap IG, CT/NG NAA, and HPV (high risk) - POCT urine pregnancy - POCT Wet Prep with KOH - POCT urinalysis dipstick - Ambulatory referral to Gynecology patient wants another Mirena. She did show yeast on her KOH but was asymptomatic with this or I did not treat it.  3. Sty, internal, right  - erythromycin ophthalmic  ointment; Place 1 application into the right eye 3 (three) times daily.  Dispense: 3.5 g; Refill: 0  4. Other back pain  she has a history of kyphoscoliosis with intermittent back pain and is requesting chiropractic care - Ambulatory referral to Chiropractic   I personally performed the services described in this documentation, which was scribed in my presence. The recorded information has been reviewed and is accurate.  Lesle Chris, MD  [facility], [facility]  02/18/2015 5:10 PM Gross sideeffects, risk and benefits, and alternatives of medications d/w patient. Patient is aware that all medications have potential sideeffects and we are unable to predict every sideeffect or drug-drug interaction that may occur.    Lesle Chris MD 02/18/2015 1:39 PM

## 2015-02-19 LAB — HEPATITIS C ANTIBODY: HCV Ab: NEGATIVE

## 2015-02-19 LAB — HIV ANTIBODY (ROUTINE TESTING W REFLEX): HIV: NONREACTIVE

## 2015-02-23 LAB — PAP IG, CT-NG NAA, HPV HIGH-RISK
Chlamydia Probe Amp: NEGATIVE
GC Probe Amp: NEGATIVE
HPV DNA High Risk: NOT DETECTED

## 2015-03-11 ENCOUNTER — Ambulatory Visit (INDEPENDENT_AMBULATORY_CARE_PROVIDER_SITE_OTHER): Payer: BLUE CROSS/BLUE SHIELD | Admitting: Women's Health

## 2015-03-11 ENCOUNTER — Encounter: Payer: Self-pay | Admitting: Women's Health

## 2015-03-11 VITALS — BP 128/80 | Ht 68.0 in | Wt 207.0 lb

## 2015-03-11 DIAGNOSIS — Z30018 Encounter for initial prescription of other contraceptives: Secondary | ICD-10-CM

## 2015-03-11 DIAGNOSIS — N912 Amenorrhea, unspecified: Secondary | ICD-10-CM

## 2015-03-11 DIAGNOSIS — Z01419 Encounter for gynecological examination (general) (routine) without abnormal findings: Secondary | ICD-10-CM

## 2015-03-11 DIAGNOSIS — Z8632 Personal history of gestational diabetes: Secondary | ICD-10-CM | POA: Insufficient documentation

## 2015-03-11 NOTE — Progress Notes (Signed)
Tanya Ingram Aug 07, 1975 [id number]    History:    Presents for new patient with problem. Had Mirena IUD placed 2008, amenorrhea. Has 2 children ages 99 and 78. 01/2015 had annual exam at primary care, normal Pap with negative HR HPV normal CBC, CMP, hep C and HIV. Lipid panel was elevated. Reports normal Pap history. GDM first child 10 lbs. 11 oz.  Past medical history, past surgical history, family history and social history were all reviewed and documented in the EPIC chart. Works for [employer].  ROS:  A ROS was performed and pertinent positives and negatives are included.  Exam:  Filed Vitals:   03/11/15 1008  BP: 128/80    General appearance:  Normal Thyroid:  Symmetrical, normal in size, without palpable masses or nodularity. Respiratory  Auscultation:  Clear without wheezing or rhonchi Cardiovascular  Auscultation:  Regular rate, without rubs, murmurs or gallops  Edema/varicosities:  Not grossly evident Abdominal  Soft,nontender, without masses, guarding or rebound.  Liver/spleen:  No organomegaly noted  Hernia:  None appreciated  Skin  Inspection:  Grossly normal   Breasts: Examined lying and sitting.     Right: Without masses, retractions, discharge or axillary adenopathy.     Left: Without masses, retractions, discharge or axillary adenopathy. Gentitourinary   Inguinal/mons:  Normal without inguinal adenopathy  External genitalia:  Normal  BUS/Urethra/Skene's glands:  Normal  Vagina:  Normal  Cervix:  Normal IUD strings visible  Uterus:   normal in size, shape and contour.  Midline and mobile  Adnexa/parametria:     Rt: Without masses or tenderness.   Lt: Without masses or tenderness.  Anus and perineum: Normal  Digital rectal exam: Normal sphincter tone without palpated masses or tenderness  Assessment/Plan:  39 y.o. MWF G2P2 for requesting Mirena IUD to be removed and replaced.  2008 Mirena IUD amenorrhea Mildly elevated cholesterol at primary  care Obesity Smoking Vap cigarettes  Plan: Check Mirena IUD coverage return to office for Dr. Lily Peer to remove and replace. Contraception options reviewed, reviewed risks of blood clots and strokes with OCs continues to use vap cigarettes and reviewed best not to use OCPs. Risks of infection, perforation and hemorrhage with Mirena IUD reviewed and accepted. SBE's, annual screening mammogram at 40, calcium rich diet, vitamin D 1000 daily encouraged. UPT-negative. Aware of hazards of smoking is trying to quit at this time.  Harrington Challenger WHNP, 1:08 PM 03/11/2015

## 2015-03-11 NOTE — Addendum Note (Signed)
Addended by: Rushie Goltz on: 03/11/2015 04:38 PM   Modules accepted: Orders

## 2015-03-11 NOTE — Patient Instructions (Signed)
Levonorgestrel intrauterine device (IUD) What is this medicine? LEVONORGESTREL IUD (LEE voe nor jes trel) is a contraceptive (birth control) device. The device is placed inside the uterus by a healthcare professional. It is used to prevent pregnancy and can also be used to treat heavy bleeding that occurs during your period. Depending on the device, it can be used for 3 to 5 years. This medicine may be used for other purposes; ask your health care provider or pharmacist if you have questions. COMMON BRAND NAME(S): LILETTA, Mirena, Skyla What should I tell my health care provider before I take this medicine? They need to know if you have any of these conditions: -abnormal Pap smear -cancer of the breast, uterus, or cervix -diabetes -endometritis -genital or pelvic infection now or in the past -have more than one sexual partner or your partner has more than one partner -heart disease -history of an ectopic or tubal pregnancy -immune system problems -IUD in place -liver disease or tumor -problems with blood clots or take blood-thinners -use intravenous drugs -uterus of unusual shape -vaginal bleeding that has not been explained -an unusual or allergic reaction to levonorgestrel, other hormones, silicone, or polyethylene, medicines, foods, dyes, or preservatives -pregnant or trying to get pregnant -breast-feeding How should I use this medicine? This device is placed inside the uterus by a health care professional. Talk to your pediatrician regarding the use of this medicine in children. Special care may be needed. Overdosage: If you think you have taken too much of this medicine contact a poison control center or emergency room at once. NOTE: This medicine is only for you. Do not share this medicine with others. What if I miss a dose? This does not apply. What may interact with this medicine? Do not take this medicine with any of the following  medications: -amprenavir -bosentan -fosamprenavir This medicine may also interact with the following medications: -aprepitant -barbiturate medicines for inducing sleep or treating seizures -bexarotene -griseofulvin -medicines to treat seizures like carbamazepine, ethotoin, felbamate, oxcarbazepine, phenytoin, topiramate -modafinil -pioglitazone -rifabutin -rifampin -rifapentine -some medicines to treat HIV infection like atazanavir, indinavir, lopinavir, nelfinavir, tipranavir, ritonavir -St. John's wort -warfarin This list may not describe all possible interactions. Give your health care provider a list of all the medicines, herbs, non-prescription drugs, or dietary supplements you use. Also tell them if you smoke, drink alcohol, or use illegal drugs. Some items may interact with your medicine. What should I watch for while using this medicine? Visit your doctor or health care professional for regular check ups. See your doctor if you or your partner has sexual contact with others, becomes HIV positive, or gets a sexual transmitted disease. This product does not protect you against HIV infection (AIDS) or other sexually transmitted diseases. You can check the placement of the IUD yourself by reaching up to the top of your vagina with clean fingers to feel the threads. Do not pull on the threads. It is a good habit to check placement after each menstrual period. Call your doctor right away if you feel more of the IUD than just the threads or if you cannot feel the threads at all. The IUD may come out by itself. You may become pregnant if the device comes out. If you notice that the IUD has come out use a backup birth control method like condoms and call your health care provider. Using tampons will not change the position of the IUD and are okay to use during your period. What side effects may   I notice from receiving this medicine? Side effects that you should report to your doctor or  health care professional as soon as possible: -allergic reactions like skin rash, itching or hives, swelling of the face, lips, or tongue -fever, flu-like symptoms -genital sores -high blood pressure -no menstrual period for 6 weeks during use -pain, swelling, warmth in the leg -pelvic pain or tenderness -severe or sudden headache -signs of pregnancy -stomach cramping -sudden shortness of breath -trouble with balance, talking, or walking -unusual vaginal bleeding, discharge -yellowing of the eyes or skin Side effects that usually do not require medical attention (report to your doctor or health care professional if they continue or are bothersome): -acne -breast pain -change in sex drive or performance -changes in weight -cramping, dizziness, or faintness while the device is being inserted -headache -irregular menstrual bleeding within first 3 to 6 months of use -nausea This list may not describe all possible side effects. Call your doctor for medical advice about side effects. You may report side effects to FDA at 1-800-FDA-1088. Where should I keep my medicine? This does not apply. NOTE: This sheet is a summary. It may not cover all possible information. If you have questions about this medicine, talk to your doctor, pharmacist, or health care provider.  2015, Elsevier/Gold Standard. (2011-07-12 13:54:04)  

## 2015-03-12 LAB — URINALYSIS W MICROSCOPIC + REFLEX CULTURE
BILIRUBIN URINE: NEGATIVE
Bacteria, UA: NONE SEEN [HPF]
CRYSTALS: NONE SEEN [HPF]
Casts: NONE SEEN [LPF]
GLUCOSE, UA: NEGATIVE
Hgb urine dipstick: NEGATIVE
KETONES UR: NEGATIVE
Leukocytes, UA: NEGATIVE
Nitrite: NEGATIVE
PH: 6.5 (ref 5.0–8.0)
Protein, ur: NEGATIVE
SPECIFIC GRAVITY, URINE: 1.026 (ref 1.001–1.035)
Yeast: NONE SEEN [HPF]

## 2015-03-12 LAB — PREGNANCY, URINE: Preg Test, Ur: NEGATIVE

## 2015-03-14 ENCOUNTER — Telehealth: Payer: Self-pay | Admitting: Gynecology

## 2015-03-14 LAB — URINE CULTURE: Colony Count: 85000

## 2015-03-14 NOTE — Telephone Encounter (Signed)
03/14/15-I LM VM for pt that her BC ins will cover the Mirena replacement for contraception at 100%, no copay/Appt already made with JF/wl

## 2015-04-08 ENCOUNTER — Ambulatory Visit (INDEPENDENT_AMBULATORY_CARE_PROVIDER_SITE_OTHER): Payer: BLUE CROSS/BLUE SHIELD | Admitting: Gynecology

## 2015-04-08 ENCOUNTER — Encounter: Payer: Self-pay | Admitting: Gynecology

## 2015-04-08 VITALS — BP 126/80

## 2015-04-08 DIAGNOSIS — Z975 Presence of (intrauterine) contraceptive device: Secondary | ICD-10-CM | POA: Insufficient documentation

## 2015-04-08 DIAGNOSIS — Z23 Encounter for immunization: Secondary | ICD-10-CM

## 2015-04-08 DIAGNOSIS — Z30433 Encounter for removal and reinsertion of intrauterine contraceptive device: Secondary | ICD-10-CM

## 2015-04-08 NOTE — Addendum Note (Signed)
Addended by: Berna SpareASTILLO, Theador Jezewski A on: 04/08/2015 09:58 AM   Modules accepted: Orders

## 2015-04-08 NOTE — Progress Notes (Signed)
   Patient is a 39 year old who was seen in the office back in September this year for her annual exam and had stated that her Mirena IUD had been placed back in 2008. She has done well has been amenorrheic. She is here to remove the expired IUD with replacement of a new one. Patient also requesting flu vaccine.                                                                    IUD procedure note       Patient presented to the office today for placement of Mirena IUD. The patient had previously been provided with literature information on this method of contraception. The risks benefits and pros and cons were discussed and all her questions were answered. She is fully aware that this form of contraception is 99% effective and is good for 5 years.  Pelvic exam: Bartholin urethra Skene glands: Within normal limits Vagina: No lesions or discharge Cervix: No lesions or discharge Uterus: Anteverted position Adnexa: No masses or tenderness Rectal exam: Not done  The cervix was cleansed with Betadine solution. A single-tooth tenaculum was placed on the anterior cervical lip. The uterus sounded to 7-1/2 centimeter. The IUD string was seen grasped with a ring forcep and retracted shown to the patient and discarded. The new IUD was shown to the patient and inserted in a sterile fashion. The IUD string was trimmed. The single-tooth tenaculum was removed. Patient was instructed to return back to the office in one month for follow up.       Patient received the flu vaccine today  Mirena IUD placed 04/08/2015 good for 5 years.  Lot number [id number]

## 2015-05-12 ENCOUNTER — Ambulatory Visit (INDEPENDENT_AMBULATORY_CARE_PROVIDER_SITE_OTHER): Payer: BLUE CROSS/BLUE SHIELD | Admitting: Gynecology

## 2015-05-12 ENCOUNTER — Encounter: Payer: Self-pay | Admitting: Gynecology

## 2015-05-12 VITALS — BP 130/78 | Ht 68.0 in | Wt 202.0 lb

## 2015-05-12 DIAGNOSIS — Z30431 Encounter for routine checking of intrauterine contraceptive device: Secondary | ICD-10-CM | POA: Diagnosis not present

## 2015-05-12 NOTE — Progress Notes (Signed)
   Patient is a 39 year old who was seen in the office on 04/08/2015 who had her expired Mirena IUD removed and replaced with a new one. Patient is asymptomatic doing well with a new IUD.  Exam: Abdomen: Soft nontender no rebound or guarding Pelvic: Bartholin urethra Skene was within normal limits Vagina: No lesions or discharge Cervix: IUD string was visualized and trimmed Uterus: Anteverted normal size shape and consistency Adnexa: No palpable masses or tenderness Rectal exam not done  Assessment/plan: Patient status post replacement of Mirena IUD for a new one approximate 1 month ago doing well. Patient scheduled to return back next year for annual exam or when necessary.

## 2015-06-01 ENCOUNTER — Encounter (HOSPITAL_COMMUNITY): Payer: Self-pay | Admitting: Emergency Medicine

## 2015-06-01 ENCOUNTER — Emergency Department (HOSPITAL_COMMUNITY)
Admission: EM | Admit: 2015-06-01 | Discharge: 2015-06-01 | Disposition: A | Discharge status: Home / Self Care | Payer: BLUE CROSS/BLUE SHIELD | Attending: Emergency Medicine | Admitting: Emergency Medicine

## 2015-06-01 DIAGNOSIS — Z8659 Personal history of other mental and behavioral disorders: Secondary | ICD-10-CM | POA: Diagnosis not present

## 2015-06-01 DIAGNOSIS — Z87891 Personal history of nicotine dependence: Secondary | ICD-10-CM | POA: Diagnosis not present

## 2015-06-01 DIAGNOSIS — K0889 Other specified disorders of teeth and supporting structures: Secondary | ICD-10-CM | POA: Insufficient documentation

## 2015-06-01 DIAGNOSIS — K029 Dental caries, unspecified: Secondary | ICD-10-CM | POA: Diagnosis not present

## 2015-06-01 DIAGNOSIS — Z7982 Long term (current) use of aspirin: Secondary | ICD-10-CM | POA: Insufficient documentation

## 2015-06-01 MED ORDER — OXYCODONE HCL 5 MG PO TABS
5.0000 mg | ORAL_TABLET | Freq: Once | ORAL | Status: AC
  Administered 2015-06-01: 5 mg via ORAL
  Filled 2015-06-01: qty 1

## 2015-06-01 MED ORDER — OXYCODONE HCL 5 MG PO TABS: 5.0000 mg | ORAL_TABLET | Freq: Four times a day (QID) | ORAL | Status: DC | PRN

## 2015-06-01 NOTE — ED Notes (Signed)
Pt. reports right lower molar pain onset last Saturday worse today , pain radiating to right ear and right jaw.

## 2015-06-01 NOTE — ED Provider Notes (Signed)
CSN: [id number]     Arrival date & time 06/01/15  0013 History   First MD Initiated Contact with Patient 06/01/15 0045     Chief Complaint  Patient presents with  . Dental Pain    (Consider location/radiation/quality/duration/timing/severity/associated sxs/prior Treatment) Patient is a 38 y.o. female presenting with tooth pain. The history is provided by the patient. No language interpreter was used.  Dental Pain Associated symptoms: no facial swelling   Tanya Ingram is a 39 year old female who reports intermittent shooting right-sided facial pain along right the mandible for the past 4 days that radiates to her right ear and jaw. She states she has an exposed nerve from a broken tooth and just got insurance. She states she would not have come in today and would've seen a dentist if she had one. She states that she just needs some relief until she can see one. She took ibuprofen with minimal relief. She denies any fever, chills, swelling of the face.  Past Medical History  Diagnosis Date  . Depression    Past Surgical History  Procedure Laterality Date  . Dilation and curettage of uterus    . Eye surgery     Family History  Problem Relation Age of Onset  . Stroke Paternal Grandmother   . Diabetes Brother    Social History  Substance Use Topics  . Smoking status: Former Games developer  . Smokeless tobacco: None     Comment: vapor  . Alcohol Use: 0.6 oz/week    1 Standard drinks or equivalent per week     Comment: socially   OB History    Gravida Para Term Preterm AB TAB SAB Ectopic Multiple Living   2    2   0 2      Review of Systems  HENT: Positive for dental problem. Negative for facial swelling.   Respiratory: Negative for shortness of breath.       Allergies  Nickel and Percocet  Home Medications   Prior to Admission medications   Medication Sig Start Date End Date Taking? Authorizing Provider  Acetaminophen (TYLENOL PO) Take by mouth as needed.    Historical  Provider, MD  aspirin 325 MG tablet Take 325 mg by mouth as needed.    Historical Provider, MD  ibuprofen (ADVIL,MOTRIN) 200 MG tablet Take 600 mg by mouth every 6 (six) hours as needed. For pain      Historical Provider, MD  levonorgestrel (MIRENA) 20 MCG/24HR IUD 1 each by Intrauterine route once. Inserted May 2008     Historical Provider, MD  oxyCODONE (OXY IR/ROXICODONE) 5 MG immediate release tablet Take 1 tablet (5 mg total) by mouth every 6 (six) hours as needed for severe pain. 06/01/15   Zachary Lovins Patel-Mills, PA-C   BP 119/81 mmHg  Pulse 74  Temp(Src) 99 F (37.2 C) (Oral)  Resp 18  SpO2 98%  LMP 04/28/2015 Physical Exam  Constitutional: She is oriented to person, place, and time. She appears well-developed and well-nourished.  Pacing back and forth in the room and pain.  HENT:  Head: Normocephalic and atraumatic.  Mouth/Throat: Uvula is midline and oropharynx is clear and moist. No oral lesions. No trismus in the jaw. Abnormal dentition. Dental caries present. No dental abscesses or uvula swelling.  Multiple dental caries throughout. No signs of dental abscess. No gum swelling or facial swelling. No reproducible dental pain with palpation. Uvula is midline. Oropharynx is clear and moist. No drooling or trismus. No difficulty breathing.  Eyes: Conjunctivae are  normal.  Neck: Normal range of motion. Neck supple.  Cardiovascular: Normal rate.   Pulmonary/Chest: Effort normal. No respiratory distress.  Abdominal: Soft.  Musculoskeletal: Normal range of motion.  Neurological: She is alert and oriented to person, place, and time.  Skin: Skin is warm and dry.  Psychiatric: She has a normal mood and affect.  Nursing note and vitals reviewed.   ED Course  Procedures (including critical care time) Labs Review Labs Reviewed - No data to display  Imaging Review No results found. I have personally reviewed and evaluated these images and lab results as part of my medical  decision-making.   EKG Interpretation None      MDM   Final diagnoses:  Pain, dental   Patient presents for dental pain. She has no signs of deep infection or ludwigs angina. I believe this is nerve pain from fractured tooth with nerve exposure. I discussed return precautions with the patient. She was given 2 dental referrals. Patient verbally agrees the plan. Medications  oxyCODONE (Oxy IR/ROXICODONE) immediate release tablet 5 mg (5 mg Oral Given 06/01/15 0144)   Filed Vitals:   06/01/15 0019 06/01/15 0140  BP: 129/85 119/81  Pulse: 81 74  Temp: 99.1 F (37.3 C) 99 F (37.2 C)  Resp: 18 [id number]        Tanya Cenci Patel-Mills, PA-C 06/01/15 0151  Tomasita CrumbleAdeleke Oni, MD 06/01/15 [phone]

## 2015-07-22 ENCOUNTER — Ambulatory Visit (INDEPENDENT_AMBULATORY_CARE_PROVIDER_SITE_OTHER): Payer: BLUE CROSS/BLUE SHIELD | Admitting: Urgent Care

## 2015-07-22 VITALS — BP 120/80 | HR 101 | Temp 99.2°F | Resp 18 | Wt 201.6 lb

## 2015-07-22 DIAGNOSIS — R350 Frequency of micturition: Secondary | ICD-10-CM

## 2015-07-22 DIAGNOSIS — R300 Dysuria: Secondary | ICD-10-CM

## 2015-07-22 DIAGNOSIS — N3090 Cystitis, unspecified without hematuria: Secondary | ICD-10-CM

## 2015-07-22 LAB — POCT URINALYSIS DIP (MANUAL ENTRY)
BILIRUBIN UA: NEGATIVE
Glucose, UA: NEGATIVE
Ketones, POC UA: NEGATIVE
LEUKOCYTES UA: NEGATIVE
NITRITE UA: NEGATIVE
PH UA: 6
Protein Ur, POC: NEGATIVE
RBC UA: NEGATIVE
Spec Grav, UA: <=1.005
UROBILINOGEN UA: 0.2

## 2015-07-22 LAB — POC MICROSCOPIC URINALYSIS (UMFC): Mucus: ABSENT

## 2015-07-22 MED ORDER — CIPROFLOXACIN HCL 500 MG PO TABS: 500.0000 mg | ORAL_TABLET | Freq: Two times a day (BID) | ORAL | Status: DC

## 2015-07-22 MED ORDER — PHENAZOPYRIDINE HCL 95 MG PO TABS: 95.0000 mg | ORAL_TABLET | Freq: Three times a day (TID) | ORAL | Status: DC | PRN

## 2015-07-22 NOTE — Patient Instructions (Signed)

## 2015-07-22 NOTE — Progress Notes (Signed)
    MRN: [id number] DOB: 03-24-1976  Subjective:   Tanya Ingram is a 40 y.o. female presenting for chief complaint of Urinary Tract Infection  Reports ~2 week history of intermittent dysuria, urinary frequency and urinary urgency. Has tried otc urinary pain with minimal relief. Of note, patient has had history of cystitis and pyelonephritis. Denies hematuria, flank pain, abdominal pain, pelvic pain, cloudy malordorous urine, genital rash, genital irritation and vaginal discharge, nausea and vomiting. Denies any other aggravating or relieving factors, no other questions or concerns.  Tanya Ingram has a current medication list which includes the following prescription(s): levonorgestrel. Patient is allergic to nickel and percocet.  Tanya Ingram  has a past medical history of Depression. Also  has past surgical history that includes Dilation and curettage of uterus and Eye surgery.  Objective:   Vitals: BP 120/80 mmHg  Pulse 101  Temp(Src) 99.2 F (37.3 C) (Oral)  Resp 18  Wt 201 lb 9.6 oz (91.445 kg)  SpO2 98%  Physical Exam  Constitutional: She is oriented to person, place, and time. She appears well-developed and well-nourished.  Cardiovascular: Normal rate, regular rhythm and intact distal pulses.  Exam reveals no gallop and no friction rub.   No murmur heard. Pulmonary/Chest: No respiratory distress. She has no wheezes. She has no rales.  Abdominal: Soft. Bowel sounds are normal. She exhibits no distension and no mass. There is no tenderness.  No CVA tenderness.  Neurological: She is alert and oriented to person, place, and time.  Skin: Skin is warm and dry.   Results for orders placed or performed in visit on 07/22/15 (from the past 24 hour(s))  POCT urinalysis dipstick     Status: None   Collection Time: 07/22/15  1:42 PM  Result Value Ref Range   Color, UA yellow yellow   Clarity, UA clear clear   Glucose, UA negative negative   Bilirubin, UA negative negative   Ketones, POC  UA negative negative   Spec Grav, UA <=1.005    Blood, UA negative negative   pH, UA 6.0    Protein Ur, POC negative negative   Urobilinogen, UA 0.2    Nitrite, UA Negative Negative   Leukocytes, UA Negative Negative  POCT Microscopic Urinalysis ([facility])     Status: Abnormal   Collection Time: 07/22/15  1:43 PM  Result Value Ref Range   WBC,UR,HPF,POC None None WBC/hpf   RBC,UR,HPF,POC None None RBC/hpf   Bacteria Few (A) None, Too numerous to count   Mucus Absent Absent   Epithelial Cells, UR Per Microscopy Few (A) None, Too numerous to count cells/hpf   Assessment and Plan :   1. Cystitis 2. Increased urinary frequency 3. Dysuria - Start cipro x5 days, urine culture pending. Pyridium for dysuria, advised aggressive hydration. RTC in 1 week if no improvement.  Wallis Bamberg, PA-C [facility] [phone] 07/22/2015 1:27 PM

## 2015-07-23 LAB — URINE CULTURE: Colony Count: 75000

## 2015-09-12 ENCOUNTER — Ambulatory Visit (INDEPENDENT_AMBULATORY_CARE_PROVIDER_SITE_OTHER): Payer: BLUE CROSS/BLUE SHIELD | Admitting: Family Medicine

## 2015-09-12 VITALS — BP 102/70 | HR 70 | Temp 98.2°F | Resp 12 | Ht 69.0 in | Wt 199.4 lb

## 2015-09-12 DIAGNOSIS — H00016 Hordeolum externum left eye, unspecified eyelid: Secondary | ICD-10-CM | POA: Diagnosis not present

## 2015-09-12 DIAGNOSIS — L03213 Periorbital cellulitis: Secondary | ICD-10-CM

## 2015-09-12 MED ORDER — AMOXICILLIN-POT CLAVULANATE 875-125 MG PO TABS: 1.0000 | ORAL_TABLET | Freq: Two times a day (BID) | ORAL | Status: DC

## 2015-09-12 MED ORDER — CEFTRIAXONE SODIUM 1 G IJ SOLR
1.0000 g | Freq: Once | INTRAMUSCULAR | Status: AC
  Administered 2015-09-12: 1 g via INTRAMUSCULAR

## 2015-09-12 MED ORDER — SULFAMETHOXAZOLE-TRIMETHOPRIM 800-160 MG PO TABS
2.0000 | ORAL_TABLET | Freq: Two times a day (BID) | ORAL | Status: DC
Start: 2015-09-12 — End: 2015-09-20

## 2015-09-12 NOTE — Patient Instructions (Addendum)
   IF you received an x-ray today, you will receive an invoice from [facility]. Please contact [facility] at [phone] with questions or concerns regarding your invoice.   IF you received labwork today, you will receive an invoice from [facility]. Please contact [facility] at [phone] with questions or concerns regarding your invoice.   Our billing staff will not be able to assist you with questions regarding bills from these companies.  You will be contacted with the lab results as soon as they are available. The fastest way to get your results is to activate your My Chart account. Instructions are located on the last page of this paperwork. If you have not heard from us regarding the results in 2 weeks, please contact this office.    Stye A stye is a bump on your eyelid caused by a bacterial infection. A stye can form inside the eyelid (internal stye) or outside the eyelid (external stye). An internal stye may be caused by an infected oil-producing gland inside your eyelid. An external stye may be caused by an infection at the base of your eyelash (hair follicle). Styes are very common. Anyone can get them at any age. They usually occur in just one eye, but you may have more than one in either eye.  CAUSES  The infection is almost always caused by bacteria called Staphylococcus aureus. This is a common type of bacteria that lives on your skin. RISK FACTORS You may be at higher risk for a stye if you have had one before. You may also be at higher risk if you have:  Diabetes.  Long-term illness.  Long-term eye redness.  A skin condition called seborrhea.  High fat levels in your blood (lipids). SIGNS AND SYMPTOMS  Eyelid pain is the most common symptom of a stye. Internal styes are more painful than external styes. Other signs and symptoms may include:  Painful swelling of your eyelid.  A scratchy feeling in your eye.  Tearing  and redness of your eye.  Pus draining from the stye. DIAGNOSIS  Your health care provider may be able to diagnose a stye just by examining your eye. The health care provider may also check to make sure:  You do not have a fever or other signs of a more serious infection.  The infection has not spread to other parts of your eye or areas around your eye. TREATMENT  Most styes will clear up in a few days without treatment. In some cases, you may need to use antibiotic drops or ointment to prevent infection. Your health care provider may have to drain the stye surgically if your stye is:  Large.  Causing a lot of pain.  Interfering with your vision. This can be done using a thin blade or a needle.  HOME CARE INSTRUCTIONS   Take medicines only as directed by your health care provider.  Apply a clean, warm compress to your eye for 10 minutes, 4 times a day.  Do not wear contact lenses or eye makeup until your stye has healed.  Do not try to pop or drain the stye. SEEK MEDICAL CARE IF:  You have chills or a fever.  Your stye does not go away after several days.  Your stye affects your vision.  Your eyeball becomes swollen, red, or painful. MAKE SURE YOU:  Understand these instructions.  Will watch your condition.  Will get help right away if you are not doing well or get   worse.   This information is not intended to replace advice given to you by your health care provider. Make sure you discuss any questions you have with your health care provider.   Document Released: 03/21/2005 Document Revised: 07/02/2014 Document Reviewed: 09/25/2013 Elsevier Interactive Patient Education 2016 Elsevier Inc. Preseptal Cellulitis, Adult Preseptal cellulitis--also called periorbital cellulitis--is an infection that can affect your eyelid and the soft tissues or skin that surround your eye. The infection may also affect the structures that produce and drain your tears. It does not affect  your eye itself. CAUSES This condition may be caused by:  Bacterial infection.  Long-term (chronic) sinus infections.  An object (foreign body) that is stuck behind the eye.  An injury that:  Goes through the eyelid tissues.  Causes an infection, such as an insect sting.  Fracture of the bone around the eye.  Infections that have spread from the eyelid or other structures around the eye.  Bite wounds.  Inflammation or infection of the lining membranes of the brain (meningitis).  An infection in the blood (septicemia).  Dental infection (abscess).  Viral infection. This is rare. RISK FACTORS Risk factors for preseptal cellulitis include:  Participating in activities that increase your risk of trauma to the face or head, such as boxing or high-speed activities.  Having a weakened defense system (immune system).  Medical conditions, such as nasal polyps, that increase your risk for frequent or recurrent sinus infections.  Not receiving regular dental care. SYMPTOMS Symptoms of this condition usually come on suddenly. Symptoms may include:  Red, hot, and swollen eyelids.  Fever.  Difficulty opening your eye.  Eye pain. DIAGNOSIS This condition may be diagnosed by an eye exam. You may also have tests, such as:  Blood tests.  CT scan.  MRI.  Spinal tap (lumbar puncture). This is a procedure that involves removing and examining a small amount of the fluid that surrounds the brain and spinal cord. This checks for meningitis. TREATMENT Treatment for this condition will include antibiotic medicines. These may be given by mouth (orally), through an IV, or as a shot. Your health care provider may also recommend nasal decongestants to reduce swelling. HOME CARE INSTRUCTIONS  Take your antibiotic medicine as directed by your health care provider. Finish all of it even if you start to feel better.  Take medicines only as directed by your health care  provider.  Drink enough fluid to keep your urine clear or pale yellow.  Do not use any tobacco products, including cigarettes, chewing tobacco, or electronic cigarettes. If you need help quitting, ask your health care provider.  Keep all follow-up visits as directed by your health care provider. These include any visits with an eye specialist (ophthalmologist) or dentist. SEEK MEDICAL CARE IF:  You have a fever.  Your eyelids become more red, warm, or swollen.  You have new symptoms.  Your symptoms do not get better with treatment. SEEK IMMEDIATE MEDICAL CARE IF:  You develop double vision, or your vision becomes blurred or worsens in any way.  You have trouble moving your eyes.  Your eye looks like it is sticking out or bulging out (proptosis).  You develop a severe headache, severe neck pain, or neck stiffness.  You develop repeated vomiting.   This information is not intended to replace advice given to you by your health care provider. Make sure you discuss any questions you have with your health care provider.   Document Released: 07/14/2010 Document Revised: 10/26/2014 Document  Reviewed: 06/07/2014 Elsevier Interactive Patient Education [employer].

## 2015-09-12 NOTE — Progress Notes (Signed)
[facility] [address], [address] [place], [place] [address] [phone]  Date:  09/12/2015   Name:  Tanya Ingram   DOB:  1976-04-23   MRN:  [id number]  PCP:  Pcp Not In System    Chief Complaint: Conjunctivitis   History of Present Illness:  Tanya Ingram is a 40 y.o. very pleasant female patient who presents with the following: Left superior eyelid swelling x 2 days:  - Swelling began 48 hours ago - No change over last 48 hours.  - No pain.  - No history of eye infection or sick contacts.  - Used erythromycin ointment last few days.  - 1st stye on right 4 months ago. None previously - No systemic symptoms as listed in RoS. - No vision changes.   - Leaving on 48 hour field trip with son's class tomorrow.   Patient Active Problem List   Diagnosis Date Noted  . IUD (intrauterine device) in place 04/08/2015  . GDM (gestational diabetes mellitus) 03/11/2015    Past Medical History  Diagnosis Date  . Depression     Past Surgical History  Procedure Laterality Date  . Dilation and curettage of uterus    . Eye surgery      Social History  Substance Use Topics  . Smoking status: Former Games developer  . Smokeless tobacco: None     Comment: vapor  . Alcohol Use: 0.6 oz/week    1 Standard drinks or equivalent per week     Comment: socially    Family History  Problem Relation Age of Onset  . Stroke Paternal Grandmother   . Diabetes Brother     Allergies  Allergen Reactions  . Nickel Rash  . Percocet [Oxycodone-Acetaminophen] Rash    Medication list has been reviewed and updated.  Current Outpatient Prescriptions on File Prior to Visit  Medication Sig Dispense Refill  . levonorgestrel (MIRENA) 20 MCG/24HR IUD 1 each by Intrauterine route once. Inserted May 2008     . phenazopyridine (PYRIDIUM) 95 MG tablet Take 1 tablet (95 mg total) by mouth 3 (three) times daily as needed for pain. (Patient not taking:  Reported on 09/12/2015) 15 tablet 0   No current facility-administered medications on file prior to visit.    Review of Systems:  Review of Systems  Constitutional: Negative for fever, chills and diaphoresis.  HENT: Positive for congestion (mild).   Eyes: Positive for discharge (increased tearing and white discharge in AM only). Negative for blurred vision, double vision, photophobia, pain and redness.       Eyelid swelling  Respiratory: Negative for cough, hemoptysis, shortness of breath and wheezing.   Cardiovascular: Negative for chest pain.  Gastrointestinal: Negative for nausea, vomiting, diarrhea and constipation.  Musculoskeletal: Negative for myalgias.  Skin: Negative for rash.  Neurological: Negative for dizziness, sensory change, speech change and headaches.    Physical Examination: Filed Vitals:   09/12/15 0950  BP: 102/70  Pulse: 70  Temp: 98.2 F (36.8 C)  Resp: 12   Filed Vitals:   09/12/15 0950  Height:  (1.753 m)  Weight: 199 lb 6.4 oz (90.447 kg)   Body mass index is 29.43 kg/(m^2). Ideal Body Weight: Weight in (lb) to have BMI = 25: 168.9   GEN: WDWN, NAD, Non-toxic, A & O x 3 HEENT: Atraumatic, Normocephalic. Moderately swollen left superior eyelid. No conjunctival injection.  Punctate region ofwhite drainage seen on undersurface of the left  eyelid. Neck supple. No masses, No LAD. Ears and Nose: No external deformity. Mild drainage.  CV: RRR, No M/G/R. No JVD. No thrill. No extra heart sounds. PULM: CTA B, no wheezes, crackles, rhonchi. No retractions. No resp. distress. No accessory muscle use. EXTR: No c/c/e NEURO Normal gait. EOM intact w/o discomfort. Visual acuity listed  In chart.  PSYCH: Normally interactive. Conversant. Not depressed or anxious appearing.  Calm demeanor.    Assessment and Plan: Stye with preseptal cellulitis: No systemic symptoms. Vision wnl.  EOM non painful.  Stye appears to be draining.  Leaving on field trip with son  tomorrow. 1 dose of roecphin now.  Then treat x 10 d with augmentin and bactrim.  Discussed importance of hypervigilance due to potential complicating risks and to seek immediate medical attention of she worsens to any degree. Call with any questions.   Signed Guinevere ScarletBlake Eureka Valdes, MD

## 2015-09-15 ENCOUNTER — Telehealth: Payer: Self-pay

## 2015-09-15 NOTE — Telephone Encounter (Signed)
New symptoms   Nausea and diarrhea   [phone]

## 2015-09-15 NOTE — Telephone Encounter (Signed)
Advised pt to come in to be seen. She states the abx is probably making her have these symptoms. She will give it another day and update me tomorrow if she is still having the symptoms.

## 2015-09-20 ENCOUNTER — Ambulatory Visit (INDEPENDENT_AMBULATORY_CARE_PROVIDER_SITE_OTHER): Payer: BLUE CROSS/BLUE SHIELD | Admitting: Family Medicine

## 2015-09-20 VITALS — BP 126/82 | HR 81 | Temp 98.7°F | Resp 20 | Ht 69.0 in | Wt 197.8 lb

## 2015-09-20 DIAGNOSIS — R509 Fever, unspecified: Secondary | ICD-10-CM | POA: Diagnosis not present

## 2015-09-20 DIAGNOSIS — R590 Localized enlarged lymph nodes: Secondary | ICD-10-CM

## 2015-09-20 DIAGNOSIS — J09X2 Influenza due to identified novel influenza A virus with other respiratory manifestations: Secondary | ICD-10-CM | POA: Diagnosis not present

## 2015-09-20 DIAGNOSIS — B379 Candidiasis, unspecified: Secondary | ICD-10-CM

## 2015-09-20 LAB — POCT CBC
Granulocyte percent: 74.7 %G (ref 37–80)
HCT, POC: 36.3 % — AB (ref 37.7–47.9)
Hemoglobin: 12.8 g/dL (ref 12.2–16.2)
Lymph, poc: 0.9 (ref 0.6–3.4)
MCH, POC: 31.4 pg — AB (ref 27–31.2)
MCHC: 35.4 g/dL (ref 31.8–35.4)
MCV: 88.7 fL (ref 80–97)
MID (cbc): 0.3 (ref 0–0.9)
MPV: 8.2 fL (ref 0–99.8)
POC Granulocyte: 3.5 (ref 2–6.9)
POC LYMPH PERCENT: 19.1 %L (ref 10–50)
POC MID %: 6.2 %M (ref 0–12)
Platelet Count, POC: 174 10*3/uL (ref 142–424)
RBC: 4.09 M/uL (ref 4.04–5.48)
RDW, POC: 13.6 %
WBC: 4.7 10*3/uL (ref 4.6–10.2)

## 2015-09-20 MED ORDER — OSELTAMIVIR PHOSPHATE 75 MG PO CAPS: 75.0000 mg | ORAL_CAPSULE | Freq: Two times a day (BID) | ORAL | Status: DC

## 2015-09-20 MED ORDER — FLUCONAZOLE 150 MG PO TABS: 150.0000 mg | ORAL_TABLET | Freq: Once | ORAL | Status: DC

## 2015-09-20 NOTE — Progress Notes (Signed)
Is a 40 year old woman who works for [employer] in [employer]. She comes back week after having had left periorbital cellulitis. That problem seemed to resolve her antibiotics, although she had some yeast infection symptoms.  She's been taking Monistat for that. She also woke up this morning with a fever and swollen glands in her neck and is worried that this might be a recurrence of her cellulitis.  Patient had no nausea or vomiting.  Objective:BP 126/82 mmHg  Pulse 81  Temp(Src) 98.7 F (37.1 C) (Oral)  Resp 20  Ht 5\' 9"  (1.753 m)  Wt 197 lb 12.8 oz (89.721 kg)  BMI 29.20 kg/m2  SpO2 98% HEENT: Unremarkable Neck: Supple with posterior and anterior tender nodes, most particularly in the suboccipital area. Chest: Clear Heart: Regular no murmur Skin: Unremarkable BP 126/82 mmHg  Pulse 81  Temp(Src) 98.7 F (37.1 C) (Oral)  Resp 20  Ht 5\' 9"  (1.753 m)  Wt 197 lb 12.8 oz (89.721 kg)  BMI 29.20 kg/m2  SpO2 98% Results for orders placed or performed in visit on 09/20/15  POCT CBC  Result Value Ref Range   WBC 4.7 4.6 - 10.2 K/uL   Lymph, poc 0.9 0.6 - 3.4   POC LYMPH PERCENT 19.1 10 - 50 %L   MID (cbc) 0.3 0 - 0.9   POC MID % 6.2 0 - 12 %M   POC Granulocyte 3.5 2 - 6.9   Granulocyte percent 74.7 37 - 80 %G   RBC 4.09 4.04 - 5.48 M/uL   Hemoglobin 12.8 12.2 - 16.2 g/dL   HCT, POC 16.136.3 (A) 09.637.7 - 47.9 %   MCV 88.7 80 - 97 fL   MCH, POC 31.4 (A) 27 - 31.2 pg   MCHC 35.4 31.8 - 35.4 g/dL   RDW, POC 04.513.6 %   Platelet Count, POC 174 142 - 424 K/uL   MPV 8.2 0 - 99.8 fL       ICD-9-CM ICD-10-CM   1. Monilia infection 112.9 B37.9 fluconazole (DIFLUCAN) 150 MG tablet  2. Fever, unspecified fever cause 780.60 R50.9 POCT CBC  3. Cervical adenopathy 785.6 R59.0 POCT CBC  4. Influenza due to identified novel influenza A virus with other respiratory manifestations 488.82 J09.X2 oseltamivir (TAMIFLU) 75 MG capsule     Signed, Elvina SidleKurt Garrett Bowring, MD

## 2015-09-20 NOTE — Patient Instructions (Signed)
     IF you received an x-ray today, you will receive an invoice from [facility]. Please contact [facility] at [phone] with questions or concerns regarding your invoice.   IF you received labwork today, you will receive an invoice from [facility]. Please contact [facility] at [phone] with questions or concerns regarding your invoice.   Our billing staff will not be able to assist you with questions regarding bills from these companies.  You will be contacted with the lab results as soon as they are available. The fastest way to get your results is to activate your My Chart account. Instructions are located on the last page of this paperwork. If you have not heard from us regarding the results in 2 weeks, please contact this office.      

## 2015-09-22 ENCOUNTER — Ambulatory Visit (INDEPENDENT_AMBULATORY_CARE_PROVIDER_SITE_OTHER): Payer: BLUE CROSS/BLUE SHIELD | Admitting: Physician Assistant

## 2015-09-22 VITALS — BP 124/70 | HR 118 | Temp 99.6°F | Resp 18 | Ht 69.0 in | Wt 194.4 lb

## 2015-09-22 DIAGNOSIS — R509 Fever, unspecified: Secondary | ICD-10-CM | POA: Diagnosis not present

## 2015-09-22 DIAGNOSIS — R21 Rash and other nonspecific skin eruption: Secondary | ICD-10-CM

## 2015-09-22 LAB — POCT CBC
Granulocyte percent: 67.4 %G (ref 37–80)
HEMATOCRIT: 37.8 % (ref 37.7–47.9)
HEMOGLOBIN: 13.6 g/dL (ref 12.2–16.2)
LYMPH, POC: 0.8 (ref 0.6–3.4)
MCH, POC: 31.6 pg — AB (ref 27–31.2)
MCHC: 36 g/dL — AB (ref 31.8–35.4)
MCV: 87.8 fL (ref 80–97)
MID (cbc): 0.2 (ref 0–0.9)
MPV: 8.4 fL (ref 0–99.8)
PLATELET COUNT, POC: 143 10*3/uL (ref 142–424)
POC Granulocyte: 2 (ref 2–6.9)
POC LYMPH %: 25.9 % (ref 10–50)
POC MID %: 6.7 %M (ref 0–12)
RBC: 4.31 M/uL (ref 4.04–5.48)
RDW, POC: 13.2 %
WBC: 2.9 10*3/uL — AB (ref 4.6–10.2)

## 2015-09-22 LAB — COMPREHENSIVE METABOLIC PANEL
ALBUMIN: 4.4 g/dL (ref 3.6–5.1)
ALT: 18 U/L (ref 6–29)
AST: 22 U/L (ref 10–30)
Alkaline Phosphatase: 89 U/L (ref 33–115)
BILIRUBIN TOTAL: 0.3 mg/dL (ref 0.2–1.2)
BUN: 7 mg/dL (ref 7–25)
CHLORIDE: 100 mmol/L (ref 98–110)
CO2: 18 mmol/L — ABNORMAL LOW (ref 20–31)
CREATININE: 0.91 mg/dL (ref 0.50–1.10)
Calcium: 8.9 mg/dL (ref 8.6–10.2)
Glucose, Bld: 87 mg/dL (ref 65–99)
Potassium: 3.7 mmol/L (ref 3.5–5.3)
SODIUM: 133 mmol/L — AB (ref 135–146)
TOTAL PROTEIN: 6.6 g/dL (ref 6.1–8.1)

## 2015-09-22 NOTE — Progress Notes (Deleted)
   Patient ID: Tanya Ingram, female    DOB: 03/26/1976, 40 y.o.   MRN: [id number]  PCP: Pcp Not In System  Subjective:   Chief Complaint  Patient presents with  . Rash    Rash developed after starting medication for flu. x2 days    HPI Presents for evaluation of ***.  ***.    Review of Systems     Patient Active Problem List   Diagnosis Date Noted  . IUD (intrauterine device) in place 04/08/2015  . GDM (gestational diabetes mellitus) 03/11/2015     Prior to Admission medications   Medication Sig Start Date End Date Taking? Authorizing Provider  amoxicillin-clavulanate (AUGMENTIN) 875-125 MG tablet Take 1 tablet by mouth 2 (two) times daily. 09/12/15  Yes Guinevere ScarletBlake Williams, MD  levonorgestrel (MIRENA) 20 MCG/24HR IUD 1 each by Intrauterine route once. Inserted May 2008    Yes Historical Provider, MD  oseltamivir (TAMIFLU) 75 MG capsule Take 1 capsule (75 mg total) by mouth 2 (two) times daily. 09/20/15  Yes Elvina SidleKurt Lauenstein, MD  fluconazole (DIFLUCAN) 150 MG tablet Take 1 tablet (150 mg total) by mouth once. Patient not taking: Reported on 09/22/2015 09/20/15   Elvina SidleKurt Lauenstein, MD  sulfamethoxazole-trimethoprim (BACTRIM DS,SEPTRA DS) 800-160 MG tablet Take 2 tablets by mouth 2 (two) times daily. 09/12/15   Historical Provider, MD     Allergies  Allergen Reactions  . Nickel Rash  . Percocet [Oxycodone-Acetaminophen] Rash       Objective:  Physical Exam         Assessment & Plan:   ***

## 2015-09-22 NOTE — Patient Instructions (Addendum)
Please STOP the Tamiflu, Augmentin and Bactrim. Stay well hydrated and get plenty of rest. It's ok to take an oral antihistamine (Claritin, Zyrtec or Allegra) each day.    IF you received an x-ray today, you will receive an invoice from [facility]. Please contact [facility] at [phone] with questions or concerns regarding your invoice.   IF you received labwork today, you will receive an invoice from [facility]. Please contact [facility] at [phone] with questions or concerns regarding your invoice.   Our billing staff will not be able to assist you with questions regarding bills from these companies.  You will be contacted with the lab results as soon as they are available. The fastest way to get your results is to activate your My Chart account. Instructions are located on the last page of this paperwork. If you have not heard from us regarding the results in 2 weeks, please contact this office.

## 2015-09-22 NOTE — Progress Notes (Signed)
Subjective:     Patient ID: Tanya Ingram, female   DOB: 12/14/1975, 40 y.o.   MRN: [id number]  HPI The patient is a 40 year old white female with no PMH who presents with rash.  3/20 seen by Dr. Mayford KnifeWilliams for periorbital cellulitis. 1 shot of rocephin given, 10 day course of augmentin and bactrim started. Eye infection has since resolved. 3/28 seen by Dr. Milus GlazierLauenstein, started developing fevers, chills and body aches. No leukocytosis, no influenza nasopharyngeal swab performed. Started on tamflu x5 days 3/29 patient developed diffuse rash all over body, red bumps, non pruritic, no burning, irritation or swelling. Spread to chest, trunk, upper extremities, and face. She continues to have fevers, sweats, chills, general aches.  Some nausea and diarrhea, 1 episode of emesis. No HEENT symptoms, no chest pressure, SOB, congestions, no sore throat.   Review of Systems All pertinent ROS as above in HPI     Objective: Filed Vitals:   09/22/15 1218  BP: 124/70  Pulse: 118  Temp: 99.6 F (37.6 C)  Resp: 18      Physical Exam  Constitutional: She appears well-developed and well-nourished.  HENT:  Head: Normocephalic and atraumatic.  Right Ear: External ear normal.  Left Ear: External ear normal.  Nose: Nose normal.  Enlarged tonsils, non erythematous with no exudates.  Neck: Neck supple.  Anterior and posterior cervical chain lymphadenopathy, tender to palpation  Cardiovascular: Regular rhythm, normal heart sounds and intact distal pulses.  Exam reveals no gallop and no friction rub.   No murmur heard. Tachycardia  Pulmonary/Chest: Effort normal and breath sounds normal. No respiratory distress. She has no wheezes. She has no rales.  Skin:  Diffuse morbilliform, maculopapular rash across bilateral upper extremities, chest, abdomen, trunk and face. Areas of confluence along posterior trunk. Butterfly appearance of confluent erythema across face. Warm to touch, non-tender        Assessment:     The patient presents with rash and has been treated with antibiotics, therefore DRESS high on my differential. Due to drug reaction and lymphadenopathy will test for infectious mononucleosis as well. S&s not convincing for scarlet fever, augment coverage for group A strep, or RMSF [place] does not present with severe N/V or headaches. Will have patient stop all medications currently, rest and hydrate and follow up if symptoms do not resolve. Will consider starting prednisone at that time.    Plan:     1. Morbilliform rash - Epstein-Barr virus VCA antibody panel  2. Fever, unspecified - POCT CBC - Comprehensive metabolic panel

## 2015-09-22 NOTE — Progress Notes (Signed)
Patient ID: Tanya Ingram, female    DOB: 09/22/1975, 40 y.o.   MRN: [id number]  PCP: Pcp Not In System  Subjective:   Chief Complaint  Patient presents with  . Rash    Rash developed after starting medication for flu. x2 days    HPI Presents for evaluation of rash.  3/20 seen by Dr. Mayford KnifeWilliams for periorbital cellulitis. 1 shot of rocephin given, 10 day course of augmentin and bactrim started. Eye infection has since resolved.  3/28 seen by Dr. Milus GlazierLauenstein, started developing fevers, chills and body aches. No leukocytosis. Empirically started on tamiflu for suspected influenza  3/29 patient developed diffuse rash all over body, red bumps, non pruritic, no burning, irritation or swelling. Spread to chest, trunk, upper extremities, and face. She continues to have fevers, sweats, chills, general aches.  Some nausea and diarrhea, 1 episode of emesis. No HEENT symptoms, no chest pressure, SOB, congestions, no sore throat.    Review of Systems As above.    Patient Active Problem List   Diagnosis Date Noted  . IUD (intrauterine device) in place 04/08/2015  . GDM (gestational diabetes mellitus) 03/11/2015     Prior to Admission medications   Medication Sig Start Date End Date Taking? Authorizing Provider  amoxicillin-clavulanate (AUGMENTIN) 875-125 MG tablet Take 1 tablet by mouth 2 (two) times daily. 09/12/15  Yes Tanya ScarletBlake Williams, MD  levonorgestrel (MIRENA) 20 MCG/24HR IUD 1 each by Intrauterine route once. Inserted May 2008    Yes Historical Provider, MD  oseltamivir (TAMIFLU) 75 MG capsule Take 1 capsule (75 mg total) by mouth 2 (two) times daily. 09/20/15  Yes Elvina SidleKurt Lauenstein, MD  fluconazole (DIFLUCAN) 150 MG tablet Take 1 tablet (150 mg total) by mouth once. Patient not taking: Reported on 09/22/2015 09/20/15   Elvina SidleKurt Lauenstein, MD  sulfamethoxazole-trimethoprim (BACTRIM DS,SEPTRA DS) 800-160 MG tablet Take 2 tablets by mouth 2 (two) times daily. 09/12/15   Historical Provider,  MD     Allergies  Allergen Reactions  . Nickel Rash  . Percocet [Oxycodone-Acetaminophen] Rash       Objective:  Physical Exam  Constitutional: She is oriented to person, place, and time. She appears well-developed and well-nourished. She is active and cooperative. No distress.  BP 124/70 mmHg  Pulse 118  Temp(Src) 99.6 F (37.6 C) (Oral)  Resp 18  Ht 5\' 9"  (1.753 m)  Wt 194 lb 6.4 oz (88.179 kg)  BMI 28.69 kg/m2  SpO2 98%  HENT:  Head: Normocephalic and atraumatic.  Right Ear: Hearing, tympanic membrane, external ear and ear canal normal.  Left Ear: Hearing, tympanic membrane, external ear and ear canal normal.  Nose: Nose normal.  Mouth/Throat: Uvula is midline, oropharynx is clear and moist and mucous membranes are normal. No oral lesions. No uvula swelling.  Eyes: Conjunctivae are normal. No scleral icterus.  Neck: Normal range of motion and phonation normal. Neck supple. No thyromegaly present.  Cardiovascular: Regular rhythm and normal heart sounds.  Tachycardia present.   Pulses:      Radial pulses are 2+ on the right side, and 2+ on the left side.  Pulmonary/Chest: Effort normal and breath sounds normal.  Lymphadenopathy:       Head (right side): No tonsillar, no preauricular, no posterior auricular and no occipital adenopathy present.       Head (left side): No tonsillar, no preauricular, no posterior auricular and no occipital adenopathy present.    She has cervical adenopathy (mildly tender).       Right  cervical: Superficial cervical adenopathy present.       Left cervical: Superficial cervical adenopathy present.       Right: No supraclavicular adenopathy present.       Left: No supraclavicular adenopathy present.  Neurological: She is alert and oriented to person, place, and time. No sensory deficit.  Skin: Skin is warm, dry and intact. Rash (diffuse, areas of confluence on the trunk and malar erythema of the face) noted. Rash is maculopapular and urticarial.  No cyanosis or erythema. Nails show no clubbing.  Psychiatric: She has a normal mood and affect. Her speech is normal and behavior is normal.       Results for orders placed or performed in visit on 09/22/15  POCT CBC  Result Value Ref Range   WBC 2.9 (A) 4.6 - 10.2 K/uL   Lymph, poc 0.8 0.6 - 3.4   POC LYMPH PERCENT 25.9 10 - 50 %L   MID (cbc) 0.2 0 - 0.9   POC MID % 6.7 0 - 12 %M   POC Granulocyte 2.0 2 - 6.9   Granulocyte percent 67.4 37 - 80 %G   RBC 4.31 4.04 - 5.48 M/uL   Hemoglobin 13.6 12.2 - 16.2 g/dL   HCT, POC 16.1 09.6 - 47.9 %   MCV 87.8 80 - 97 fL   MCH, POC 31.6 (A) 27 - 31.2 pg   MCHC 36.0 (A) 31.8 - 35.4 g/dL   RDW, POC 04.5 %   Platelet Count, POC 143 142 - 424 K/uL   MPV 8.4 0 - 99.8 fL       Assessment & Plan:   1. Morbilliform rash 2. Fever, unspecified Suspect viral illness, though cannot exclude drug reaction. Stop Augmentin, Bactrim and Tamiflu. Supportive care. Await lab results. RTC if symptoms worsen/persist. - Epstein-Barr virus VCA antibody panel - POCT CBC - Comprehensive metabolic panel   Fernande Bras, PA-C Physician Assistant-Certified [facility]

## 2015-09-23 LAB — EPSTEIN-BARR VIRUS VCA ANTIBODY PANEL
EBV EA IgG: <5 U/mL (ref <9.0)
EBV NA IgG: 69.5 U/mL — ABNORMAL HIGH (ref <18.0)
EBV VCA IGG: 239 U/mL — AB (ref <18.0)

## 2016-04-30 ENCOUNTER — Telehealth: Payer: Self-pay

## 2016-04-30 ENCOUNTER — Ambulatory Visit (INDEPENDENT_AMBULATORY_CARE_PROVIDER_SITE_OTHER): Payer: BLUE CROSS/BLUE SHIELD | Admitting: Physician Assistant

## 2016-04-30 ENCOUNTER — Ambulatory Visit (HOSPITAL_COMMUNITY)
Admission: RE | Admit: 2016-04-30 | Discharge: 2016-04-30 | Disposition: A | Discharge status: Home / Self Care | Payer: BLUE CROSS/BLUE SHIELD | Source: Ambulatory Visit | Attending: Physician Assistant | Admitting: Physician Assistant

## 2016-04-30 VITALS — BP 110/76 | HR 77 | Temp 98.4°F | Resp 16 | Ht 68.0 in | Wt 191.2 lb

## 2016-04-30 DIAGNOSIS — Z23 Encounter for immunization: Secondary | ICD-10-CM

## 2016-04-30 DIAGNOSIS — R1011 Right upper quadrant pain: Secondary | ICD-10-CM | POA: Insufficient documentation

## 2016-04-30 DIAGNOSIS — R110 Nausea: Secondary | ICD-10-CM | POA: Diagnosis present

## 2016-04-30 DIAGNOSIS — R109 Unspecified abdominal pain: Secondary | ICD-10-CM | POA: Insufficient documentation

## 2016-04-30 LAB — POCT CBC
GRANULOCYTE PERCENT: 56.3 % (ref 37–80)
HEMATOCRIT: 37.6 % — AB (ref 37.7–47.9)
Hemoglobin: 13.1 g/dL (ref 12.2–16.2)
Lymph, poc: 2.5 (ref 0.6–3.4)
MCH: 31.4 pg — AB (ref 27–31.2)
MCHC: 34.8 g/dL (ref 31.8–35.4)
MCV: 90.4 fL (ref 80–97)
MID (CBC): 0.5 (ref 0–0.9)
MPV: 7.5 fL (ref 0–99.8)
POC Granulocyte: 3.9 (ref 2–6.9)
POC LYMPH PERCENT: 36.6 %L (ref 10–50)
POC MID %: 7.1 % (ref 0–12)
Platelet Count, POC: 229 10*3/uL (ref 142–424)
RBC: 4.16 M/uL (ref 4.04–5.48)
RDW, POC: 13.2 %
WBC: 6.9 10*3/uL (ref 4.6–10.2)

## 2016-04-30 LAB — POCT URINALYSIS DIP (MANUAL ENTRY)
BILIRUBIN UA: NEGATIVE
GLUCOSE UA: NEGATIVE
Ketones, POC UA: NEGATIVE
Leukocytes, UA: NEGATIVE
Nitrite, UA: NEGATIVE
Protein Ur, POC: NEGATIVE
RBC UA: NEGATIVE
SPEC GRAV UA: 1.02
Urobilinogen, UA: 0.2
pH, UA: 5.5

## 2016-04-30 LAB — POC MICROSCOPIC URINALYSIS (UMFC): Mucus: ABSENT

## 2016-04-30 MED ORDER — PANTOPRAZOLE SODIUM 40 MG PO TBEC: 40.0000 mg | DELAYED_RELEASE_TABLET | Freq: Every day | ORAL | 3 refills | Status: DC

## 2016-04-30 NOTE — Progress Notes (Signed)
Patient ID: Tanya Ingram, female    DOB: 07/21/1975, 40 y.o.   MRN: [id number]  PCP: Pcp Not In System  Subjective:   Chief Complaint  Patient presents with  . pain right side    x 1 wk, flu shot    HPI Presents for evaluation of RIGHT upper abdominal pain x 1 week. She is accompanied by her mother.  Initially, felt like a cramp and was just "annoying." Associated with epigastric abdominal pain and heartburn. She felt like it was improving, but has worsened again. Worsens following eating, typically 30-60 minutes after a meal. She has taken nothing in attempt to alleviate her symptoms, but has not eaten anything today to avoid the pain. Some relief with lying down and passing gas. Some constipation until last night when stool was somewhat unformed.  No melena, hematochezia. Frequent urination, thought due to good hydration, not increased compared to her normal. Diet is typically healthy, but she does consume fast food or pizza several times each week.  No fever, chills, difficult/painful swallowing. She relates a strong family history of gallstones and notes that she meets all the "Fs" of gall bladder disease: 40,  female, fertile, fat.    Review of Systems Constitutional: Positive for appetite change. Negative for chills, diaphoresis, fever and unexpected weight change.  HENT: Positive for congestion and postnasal drip. Negative for ear pain, rhinorrhea, sinus pain, sinus pressure, sneezing, tinnitus and trouble swallowing.   Eyes: Positive for discharge. Negative for photophobia, pain, redness, itching and visual disturbance.  Respiratory: Negative for cough, chest tightness and wheezing.   Cardiovascular: Negative for chest pain, palpitations and leg swelling.  Gastrointestinal: Positive for abdominal pain, constipation and nausea. Negative for blood in stool, diarrhea and vomiting.  Genitourinary: Positive for flank pain and frequency. Negative for difficulty urinating,  dysuria, hematuria and urgency.  Neurological: Positive for dizziness, light-headedness and headaches. Negative for syncope and weakness.  Psychiatric/Behavioral: Negative for dysphoric mood.     Patient Active Problem List   Diagnosis Date Noted  . Abdominal pain 04/30/2016  . IUD (intrauterine device) in place 04/08/2015  . GDM (gestational diabetes mellitus) 03/11/2015     Prior to Admission medications   Medication Sig Start Date End Date Taking? Authorizing Provider  levonorgestrel (MIRENA) 20 MCG/24HR IUD 1 each by Intrauterine route once. Inserted May 2008    Yes Historical Provider, MD     Allergies  Allergen Reactions  . Nickel Rash  . Percocet [Oxycodone-Acetaminophen] Rash       Objective:  Physical Exam  Constitutional: She is oriented to person, place, and time. She appears well-developed and well-nourished. She is active and cooperative. No distress.  BP 110/76   Pulse 77   Temp 98.4 F (36.9 C) (Oral)   Resp 16   Ht 5\' 8"  (1.727 m)   Wt 191 lb 3.2 oz (86.7 kg)   SpO2 100%   BMI 29.07 kg/m   HENT:  Head: Normocephalic and atraumatic.  Right Ear: Hearing normal.  Left Ear: Hearing normal.  Eyes: Conjunctivae are normal. No scleral icterus.  Neck: Normal range of motion. Neck supple. No thyromegaly present.  Cardiovascular: Normal rate, regular rhythm and normal heart sounds.   Pulses:      Radial pulses are 2+ on the right side, and 2+ on the left side.  Pulmonary/Chest: Effort normal and breath sounds normal.  Abdominal: Soft. Normal appearance and bowel sounds are normal. She exhibits no distension and no mass. There is no hepatosplenomegaly.  There is tenderness in the right upper quadrant and epigastric area. There is no rigidity, no rebound, no guarding, no CVA tenderness, no tenderness at McBurney's point and negative Murphy's sign. No hernia.  Lymphadenopathy:       Head (right side): No tonsillar, no preauricular, no posterior auricular and no  occipital adenopathy present.       Head (left side): No tonsillar, no preauricular, no posterior auricular and no occipital adenopathy present.    She has no cervical adenopathy.       Right: No supraclavicular adenopathy present.       Left: No supraclavicular adenopathy present.  Neurological: She is alert and oriented to person, place, and time. No sensory deficit.  Skin: Skin is warm, dry and intact. No rash noted. No cyanosis or erythema. Nails show no clubbing.  Psychiatric: She has a normal mood and affect. Her speech is normal and behavior is normal.       Results for orders placed or performed in visit on 04/30/16  POCT urinalysis dipstick  Result Value Ref Range   Color, UA yellow yellow   Clarity, UA cloudy (A) clear   Glucose, UA negative negative   Bilirubin, UA negative negative   Ketones, POC UA negative negative   Spec Grav, UA 1.020    Blood, UA negative negative   pH, UA 5.5    Protein Ur, POC negative negative   Urobilinogen, UA 0.2    Nitrite, UA Negative Negative   Leukocytes, UA Negative Negative  POCT Microscopic Urinalysis ([facility])  Result Value Ref Range   WBC,UR,HPF,POC None None WBC/hpf   RBC,UR,HPF,POC None None RBC/hpf   Bacteria None None, Too numerous to count   Mucus Absent Absent   Epithelial Cells, UR Per Microscopy Few (A) None, Too numerous to count cells/hpf  POCT CBC  Result Value Ref Range   WBC 6.9 4.6 - 10.2 K/uL   Lymph, poc 2.5 0.6 - 3.4   POC LYMPH PERCENT 36.6 10 - 50 %L   MID (cbc) 0.5 0 - 0.9   POC MID % 7.1 0 - 12 %M   POC Granulocyte 3.9 2 - 6.9   Granulocyte percent 56.3 37 - 80 %G   RBC 4.16 4.04 - 5.48 M/uL   Hemoglobin 13.1 12.2 - 16.2 g/dL   HCT, POC 16.137.6 (A) 09.637.7 - 47.9 %   MCV 90.4 80 - 97 fL   MCH, POC 31.4 (A) 27 - 31.2 pg   MCHC 34.8 31.8 - 35.4 g/dL   RDW, POC 04.513.2 %   Platelet Count, POC 229 142 - 424 K/uL   MPV 7.5 0 - 99.8 fL       Assessment & Plan:   1. Right upper quadrant abdominal pain RUQ US  today. If negative, try PPI. If no improvement, plan regerral to GI. - POCT urinalysis dipstick - POCT Microscopic Urinalysis ([facility]) - POCT CBC - Comprehensive metabolic panel - US Abdomen Limited RUQ; Future - pantoprazole (PROTONIX) 40 MG tablet; Take 1 tablet (40 mg total) by mouth daily.  Dispense: 30 tablet; Refill: 3  2. Needs flu shot Patient left before vaccine administered. She will return at her convenience. - Flu Vaccine QUAD 36+ mos IM   Fernande Brashelle S. Salahuddin Arismendez, PA-C Physician Assistant-Certified [facility]

## 2016-04-30 NOTE — Patient Instructions (Addendum)
Report to Radiology on 1st Floor of [facility] now for your Ultrasound.  Do not eat or drink anything on your way.   IF you received an x-ray today, you will receive an invoice from [facility]. Please contact [facility] at [phone] with questions or concerns regarding your invoice.   IF you received labwork today, you will receive an invoice from [facility]. Please contact [facility] at [phone] with questions or concerns regarding your invoice.   Our billing staff will not be able to assist you with questions regarding bills from these companies.  You will be contacted with the lab results as soon as they are available. The fastest way to get your results is to activate your My Chart account. Instructions are located on the last page of this paperwork. If you have not heard from us regarding the results in 2 weeks, please contact this office.    Influenza (Flu) Vaccine (Inactivated or Recombinant):  1. Why get vaccinated? Influenza ("flu") is a contagious disease that spreads around the [place] every year, usually between October and May. Flu is caused by influenza viruses, and is spread mainly by coughing, sneezing, and close contact. Anyone can get flu. Flu strikes suddenly and can last several days. Symptoms vary by age, but can include:  fever/chills  sore throat  muscle aches  fatigue  cough  headache  runny or stuffy nose Flu can also lead to pneumonia and blood infections, and cause diarrhea and seizures in children. If you have a medical condition, such as heart or lung disease, flu can make it worse. Flu is more dangerous for some people. Infants and young children, people 40 years of age and older, pregnant women, and people with certain health conditions or a weakened immune system are at greatest risk. Each year thousands of people in the [place] die from flu, and many more are hospitalized. Flu vaccine  can:  keep you from getting flu,  make flu less severe if you do get it, and  keep you from spreading flu to your family and other people. 2. Inactivated and recombinant flu vaccines A dose of flu vaccine is recommended every flu season. Children 6 months through 738 years of age may need two doses during the same flu season. Everyone else needs only one dose each flu season. Some inactivated flu vaccines contain a very small amount of a mercury-based preservative called thimerosal. Studies have not shown thimerosal in vaccines to be harmful, but flu vaccines that do not contain thimerosal are available. There is no live flu virus in flu shots. They cannot cause the flu. There are many flu viruses, and they are always changing. Each year a new flu vaccine is made to protect against three or four viruses that are likely to cause disease in the upcoming flu season. But even when the vaccine doesn't exactly match these viruses, it may still provide some protection. Flu vaccine cannot prevent:  flu that is caused by a virus not covered by the vaccine, or  illnesses that look like flu but are not. It takes about 2 weeks for protection to develop after vaccination, and protection lasts through the flu season. 3. Some people should not get this vaccine Tell the person who is giving you the vaccine:  If you have any severe, life-threatening allergies. If you ever had a life-threatening allergic reaction after a dose of flu vaccine, or have a severe allergy to any part of this vaccine, you  may be advised not to get vaccinated. Most, but not all, types of flu vaccine contain a small amount of egg protein.  If you ever had Guillain-Barre Syndrome (also called GBS). Some people with a history of GBS should not get this vaccine. This should be discussed with your doctor.  If you are not feeling well. It is usually okay to get flu vaccine when you have a mild illness, but you might be asked to come back  when you feel better. 4. Risks of a vaccine reaction With any medicine, including vaccines, there is a chance of reactions. These are usually mild and go away on their own, but serious reactions are also possible. Most people who get a flu shot do not have any problems with it. Minor problems following a flu shot include:  soreness, redness, or swelling where the shot was given  hoarseness  sore, red or itchy eyes  cough  fever  aches  headache  itching  fatigue If these problems occur, they usually begin soon after the shot and last 1 or 2 days. More serious problems following a flu shot can include the following:  There may be a small increased risk of Guillain-Barre Syndrome (GBS) after inactivated flu vaccine. This risk has been estimated at 1 or 2 additional cases per million people vaccinated. This is much lower than the risk of severe complications from flu, which can be prevented by flu vaccine.  Young children who get the flu shot along with pneumococcal vaccine (PCV13) and/or DTaP vaccine at the same time might be slightly more likely to have a seizure caused by fever. Ask your doctor for more information. Tell your doctor if a child who is getting flu vaccine has ever had a seizure. Problems that could happen after any injected vaccine:  People sometimes faint after a medical procedure, including vaccination. Sitting or lying down for about 15 minutes can help prevent fainting, and injuries caused by a fall. Tell your doctor if you feel dizzy, or have vision changes or ringing in the ears.  Some people get severe pain in the shoulder and have difficulty moving the arm where a shot was given. This happens very rarely.  Any medication can cause a severe allergic reaction. Such reactions from a vaccine are very rare, estimated at about 1 in a million doses, and would happen within a few minutes to a few hours after the vaccination. As with any medicine, there is a very  remote chance of a vaccine causing a serious injury or death. The safety of vaccines is always being monitored. For more information, visit: http://floyd.org/ 5. What if there is a serious reaction? What should I look for?  Look for anything that concerns you, such as signs of a severe allergic reaction, very high fever, or unusual behavior. Signs of a severe allergic reaction can include hives, swelling of the face and throat, difficulty breathing, a fast heartbeat, dizziness, and weakness. These would start a few minutes to a few hours after the vaccination. What should I do?  If you think it is a severe allergic reaction or other emergency that can't wait, call 9-1-1 and get the person to the nearest hospital. Otherwise, call your doctor.  Reactions should be reported to the Vaccine Adverse Event Reporting System (VAERS). Your doctor should file this report, or you can do it yourself through the VAERS web site at www.vaers.LAgents.no, or by calling 1-803-676-8595. VAERS does not give medical advice. 6. The National Vaccine Injury  Compensation Program The [employer] Vaccine Injury Compensation Program (VICP) is a federal program that was created to compensate people who may have been injured by certain vaccines. Persons who believe they may have been injured by a vaccine can learn about the program and about filing a claim by calling 1-385-062-6249 or visiting the VICP website at SpiritualWord.atwww.hrsa.gov/vaccinecompensation. There is a time limit to file a claim for compensation. 7. How can I learn more?  Ask your healthcare provider. He or she can give you the vaccine package insert or suggest other sources of information.  Call your local or state health department.  Contact the Centers for Disease Control and Prevention (CDC):  Call 34083285771-870-579-1933 (1-800-CDC-INFO) or  Visit CDC's website at BiotechRoom.com.cywww.cdc.gov/flu Vaccine Information Statement Inactivated Influenza Vaccine (01/29/2014)   This  information is not intended to replace advice given to you by your health care provider. Make sure you discuss any questions you have with your health care provider.   Document Released: 04/05/2006 Document Revised: 07/02/2014 Document Reviewed: 02/01/2014 Elsevier Interactive Patient Education [employer]2016 Elsevier Inc.

## 2016-04-30 NOTE — Progress Notes (Signed)
Subjective:    Patient ID: Tanya Ingram, female    DOB: 1975-08-06, 40 y.o.   MRN: [id number]  Chief Complaint  Patient presents with  . pain right side    x 1 wk, flu shot   HPI: Presents for RUQ and epigastric abdominal pain which has been present for 6 days. States when it started last week it was like an "annoying cramp" right underneath her ribs on her right side and then it got worse the next day and felt like heartburn. Did not take any heartburn relief medication at home. At the end of last week it seemed to be getting better but then would increase every time she ate. States it usually occurs about 30 mins-1 hour after eating and is sharp and cramping pains, mostly in the RUQ area with some epigastric pain and radiation to the right flank and back. States it was better yesterday and then last night it got a lot worse and states it almost felt like "light labor pains" and cramping. States she has not eaten today because of the pain. Notes "80% of the pain is in the RUQ and right flank and back and then sometimes moves to epigastrium and left abdomen. States she gets some relief with lying down and some increased relief when passing flatulence. She had nausea 5 days ago but other than that has not had any. Decreased appetite but avoiding eating more to avoid the pain associated with after eating. Endorses some urinary frequency but states she works from home and drinks a lot of water and just is constantly going to the bathroom because it is right there. Denies vomiting. Denies fevers, chills, hematuria, or dysphagia.  Endorses constipation the past 6 days as well. Last BM was last night but states they have been very light colored and not diarrhea but also not completely formed stools. Denies blood in stool. Notes a fairly healthy diet with chicken and little red meat but does admit to fried and fast food a few times a week as well as pizza. States the past few weeks she has fluctuated  between constipation and diarrhea with "nothing in between". Notes some allergic rhinitis symptoms and occasional dizziness upon standing which she says is resolved within a few seconds.  Mother present with patient today and states they have a strong family history of gallstones and need for gallbladder removal.    Review of Systems  Constitutional: Positive for appetite change. Negative for chills, diaphoresis, fever and unexpected weight change.  HENT: Positive for congestion and postnasal drip. Negative for ear pain, rhinorrhea, sinus pain, sinus pressure, sneezing, tinnitus and trouble swallowing.   Eyes: Positive for discharge. Negative for photophobia, pain, redness, itching and visual disturbance.  Respiratory: Negative for cough, chest tightness and wheezing.   Cardiovascular: Negative for chest pain, palpitations and leg swelling.  Gastrointestinal: Positive for abdominal pain, constipation and nausea. Negative for blood in stool, diarrhea and vomiting.  Genitourinary: Positive for flank pain and frequency. Negative for difficulty urinating, dysuria, hematuria and urgency.  Neurological: Positive for dizziness, light-headedness and headaches. Negative for syncope and weakness.  Psychiatric/Behavioral: Negative for dysphoric mood.   Allergies  Allergen Reactions  . Nickel Rash  . Percocet [Oxycodone-Acetaminophen] Rash   Prior to Admission medications   Medication Sig Start Date End Date Taking? Authorizing Provider  levonorgestrel (MIRENA) 20 MCG/24HR IUD 1 each by Intrauterine route once. Inserted May 2008    Yes Historical Provider, MD   Patient Active Problem  List   Diagnosis Date Noted  . Abdominal pain 04/30/2016  . IUD (intrauterine device) in place 04/08/2015  . GDM (gestational diabetes mellitus) 03/11/2015       Objective:   Physical Exam  Constitutional: She is oriented to person, place, and time. She appears well-developed and well-nourished.  HENT:  Head:  Normocephalic and atraumatic.  Eyes: Conjunctivae are normal. Pupils are equal, round, and reactive to light. Right eye exhibits no discharge. Left eye exhibits no discharge. No scleral icterus.  Neck: No tracheal deviation present. No thyromegaly present.  Cardiovascular: Normal rate, regular rhythm, normal heart sounds and intact distal pulses.  Exam reveals no gallop and no friction rub.   No murmur heard. Pulmonary/Chest: Effort normal and breath sounds normal. No respiratory distress. She has no wheezes. She has no rales.  Abdominal: She exhibits no shifting dullness, no distension, no ascites and no pulsatile midline mass. Bowel sounds are increased. There is no hepatosplenomegaly, splenomegaly or hepatomegaly. There is tenderness in the right upper quadrant, right lower quadrant and epigastric area. There is positive Murphy's sign. There is no rigidity, no rebound, no guarding, no CVA tenderness and no tenderness at McBurney's point. No hernia.  Lymphadenopathy:    She has no cervical adenopathy.  Neurological: She is alert and oriented to person, place, and time.  Skin: Skin is warm and dry.  Psychiatric: She has a normal mood and affect.   Results for orders placed or performed in visit on 04/30/16  POCT urinalysis dipstick  Result Value Ref Range   Color, UA yellow yellow   Clarity, UA cloudy (A) clear   Glucose, UA negative negative   Bilirubin, UA negative negative   Ketones, POC UA negative negative   Spec Grav, UA 1.020    Blood, UA negative negative   pH, UA 5.5    Protein Ur, POC negative negative   Urobilinogen, UA 0.2    Nitrite, UA Negative Negative   Leukocytes, UA Negative Negative  POCT Microscopic Urinalysis ([facility])  Result Value Ref Range   WBC,UR,HPF,POC None None WBC/hpf   RBC,UR,HPF,POC None None RBC/hpf   Bacteria None None, Too numerous to count   Mucus Absent Absent   Epithelial Cells, UR Per Microscopy Few (A) None, Too numerous to count cells/hpf    POCT CBC  Result Value Ref Range   WBC 6.9 4.6 - 10.2 K/uL   Lymph, poc 2.5 0.6 - 3.4   POC LYMPH PERCENT 36.6 10 - 50 %L   MID (cbc) 0.5 0 - 0.9   POC MID % 7.1 0 - 12 %M   POC Granulocyte 3.9 2 - 6.9   Granulocyte percent 56.3 37 - 80 %G   RBC 4.16 4.04 - 5.48 M/uL   Hemoglobin 13.1 12.2 - 16.2 g/dL   HCT, POC 52.837.6 (A) 41.337.7 - 47.9 %   MCV 90.4 80 - 97 fL   MCH, POC 31.4 (A) 27 - 31.2 pg   MCHC 34.8 31.8 - 35.4 g/dL   RDW, POC 24.413.2 %   Platelet Count, POC 229 142 - 424 K/uL   MPV 7.5 0 - 99.8 fL    Assessment & Plan:  1. Needs flu shot - Flu Vaccine QUAD 36+ mos IM  2. Right upper quadrant abdominal pain UA cloudy with few epithelial cells, otherwise negative. CBC within normal limits. CMP pending. Provided Protonix for heartburn symptoms and scheduled RUQ ultrasound to r/o gallbladder etiology. Instructed patient to RTC or go to the ER if  symptoms worsen or do not improve. - POCT urinalysis dipstick - POCT Microscopic Urinalysis ([facility]) - POCT CBC - Comprehensive metabolic panel - US Abdomen Limited RUQ; Future - pantoprazole (PROTONIX) 40 MG tablet; Take 1 tablet (40 mg total) by mouth daily.  Dispense: 30 tablet; Refill: 3

## 2016-04-30 NOTE — Telephone Encounter (Signed)
Spoke to patient and advised her that the results of her US were normal.

## 2016-05-01 ENCOUNTER — Encounter: Payer: Self-pay | Admitting: Physician Assistant

## 2016-05-01 DIAGNOSIS — R1011 Right upper quadrant pain: Secondary | ICD-10-CM

## 2016-05-01 LAB — COMPREHENSIVE METABOLIC PANEL
ALBUMIN: 4.4 g/dL (ref 3.6–5.1)
ALT: 10 U/L (ref 6–29)
AST: 11 U/L (ref 10–30)
Alkaline Phosphatase: 67 U/L (ref 33–115)
BILIRUBIN TOTAL: 0.6 mg/dL (ref 0.2–1.2)
BUN: 9 mg/dL (ref 7–25)
CALCIUM: 9.1 mg/dL (ref 8.6–10.2)
CHLORIDE: 107 mmol/L (ref 98–110)
CO2: 20 mmol/L (ref 20–31)
Creat: 0.68 mg/dL (ref 0.50–1.10)
GLUCOSE: 104 mg/dL — AB (ref 65–99)
Potassium: 3.9 mmol/L (ref 3.5–5.3)
Sodium: 138 mmol/L (ref 135–146)
Total Protein: 6.3 g/dL (ref 6.1–8.1)

## 2016-05-02 ENCOUNTER — Encounter: Payer: Self-pay | Admitting: Physician Assistant

## 2016-06-26 ENCOUNTER — Encounter: Payer: Self-pay | Admitting: Physician Assistant

## 2016-06-26 NOTE — Telephone Encounter (Signed)
I ordered a HIDA scan on 05/03/16. The patient hasn't received a call to schedule it. Please see what is the hold up.  Warmly, Gerron Guidotti

## 2016-07-16 ENCOUNTER — Encounter: Payer: Self-pay | Admitting: Physician Assistant

## 2016-07-18 ENCOUNTER — Encounter: Payer: Self-pay | Admitting: Physician Assistant

## 2016-07-18 NOTE — Telephone Encounter (Signed)
Received a call back, scheduling has left a VM with pt just now and will continue to follow up to ensure that she gets scheduled.

## 2016-07-18 NOTE — Telephone Encounter (Signed)
I have left yet another message with the [facility] scheduling department requesting a call back or for the patient to be contacted for scheduling. I have an almost impossible time getting them on the phone, but I will continue to call. I will also provide the patient with the scheduling number should she wish to call them as well.

## 2016-07-19 ENCOUNTER — Telehealth: Payer: Self-pay

## 2016-07-19 DIAGNOSIS — R1011 Right upper quadrant pain: Secondary | ICD-10-CM

## 2016-07-19 NOTE — Telephone Encounter (Signed)
[facility] scheduling calling to ask for the order to be changed to HIDA with ejection fraction. Please change if appropriate. She is scheduled for 07/25/16.

## 2016-07-20 NOTE — Telephone Encounter (Signed)
Orders Placed This Encounter  Procedures  . [facility] Hepato W/Eject Fract

## 2016-07-25 ENCOUNTER — Encounter (HOSPITAL_COMMUNITY): Payer: No Typology Code available for payment source

## 2016-07-25 ENCOUNTER — Encounter (HOSPITAL_COMMUNITY)
Admission: RE | Admit: 2016-07-25 | Discharge: 2016-07-25 | Disposition: A | Discharge status: Home / Self Care | Payer: BLUE CROSS/BLUE SHIELD | Source: Ambulatory Visit | Attending: Physician Assistant | Admitting: Physician Assistant

## 2016-07-25 DIAGNOSIS — R1011 Right upper quadrant pain: Secondary | ICD-10-CM | POA: Diagnosis present

## 2016-07-25 MED ORDER — TECHNETIUM TC 99M MEBROFENIN IV KIT
5.2000 | PACK | Freq: Once | INTRAVENOUS | Status: AC | PRN
  Administered 2016-07-25: 5.2 via INTRAVENOUS

## 2016-07-26 ENCOUNTER — Other Ambulatory Visit: Payer: Self-pay | Admitting: Physician Assistant

## 2016-07-26 ENCOUNTER — Encounter: Payer: Self-pay | Admitting: Physician Assistant

## 2016-07-26 ENCOUNTER — Encounter: Payer: Self-pay | Admitting: Nurse Practitioner

## 2016-07-26 DIAGNOSIS — R1011 Right upper quadrant pain: Secondary | ICD-10-CM

## 2016-08-06 ENCOUNTER — Encounter: Payer: Self-pay | Admitting: Nurse Practitioner

## 2016-08-06 ENCOUNTER — Ambulatory Visit (INDEPENDENT_AMBULATORY_CARE_PROVIDER_SITE_OTHER): Payer: BLUE CROSS/BLUE SHIELD | Admitting: Nurse Practitioner

## 2016-08-06 VITALS — BP 110/80 | HR 80 | Ht 68.0 in | Wt 196.6 lb

## 2016-08-06 DIAGNOSIS — K219 Gastro-esophageal reflux disease without esophagitis: Secondary | ICD-10-CM | POA: Diagnosis not present

## 2016-08-06 DIAGNOSIS — R194 Change in bowel habit: Secondary | ICD-10-CM | POA: Diagnosis not present

## 2016-08-06 DIAGNOSIS — R142 Eructation: Secondary | ICD-10-CM

## 2016-08-06 MED ORDER — SULINDAC 150 MG PO TABS: ORAL_TABLET | ORAL | 0 refills | Status: DC

## 2016-08-06 MED ORDER — METHOCARBAMOL 500 MG PO TABS: ORAL_TABLET | ORAL | 0 refills | Status: DC

## 2016-08-06 NOTE — Progress Notes (Addendum)
HPI:   Patient is 41 year old female referred by PCP Porfirio Oar, P.A for evaluation of abdominal pain. In late October. patient developed right sided abdominal pain /epigastric discomfort, belching and reflux symptoms. She points to right flank as main area of discomfort. The discomfort is described as crampy, stitch like. Pain radiates down into lower right back. The discomfort is not related to eating, it is worse at night. Patient sits in a chair all day at work. She has noticed that stretching and rubbing the area helps. Belching helps too. Chem profile and CBC in November were both normal. Abdominal ultrasound unremarkable. Reflux improved with PPI but she continued to have right abdominal pain.Marland KitchenHer appetite is slightly decreased but no significant weight loss. No fevers. She has no hematuria or urinary symptoms. No vomiting. Other than PPI patient has not tried anything for her right sided discomfort. .She had an unremarkable HIDA scan a couple of weeks ago Gallbladder ejection fraction was normal. In addition to above patient developed bowel changes around late October. She used to have a normal BM almost daiy but now have decreased urgency and stools vary between hard and loose.   Past Medical History:  Diagnosis Date  . Chronic tension headaches   . Depression      Past Surgical History:  Procedure Laterality Date  . DILATION AND CURETTAGE OF UTERUS    . EYE SURGERY  2000   Lasix   Family History  Problem Relation Age of Onset  . Gallstones Mother   . Stroke Maternal Grandmother   . Stroke Paternal Grandmother   . Diabetes Brother    Social History  Substance Use Topics  . Smoking status: Former Smoker    Packs/day: 0.25    Years: 20.00    Types: Cigarettes  . Smokeless tobacco: Never Used     Comment: Switch between vaping and cigarettes   . Alcohol use 0.6 oz/week    1 Standard drinks or equivalent per week     Comment: socially   Current Outpatient  Prescriptions  Medication Sig Dispense Refill  . levonorgestrel (MIRENA) 20 MCG/24HR IUD 1 each by Intrauterine route once. Inserted May 2008     . pantoprazole (PROTONIX) 40 MG tablet Take 1 tablet (40 mg total) by mouth daily. 30 tablet 3   No current facility-administered medications for this visit.    Allergies  Allergen Reactions  . Nickel Rash  . Percocet [Oxycodone-Acetaminophen] Rash     Review of Systems: All systems reviewed and negative except where noted in HPI.    Physical Exam: BP 110/80   Pulse 80   Ht 5\' 8"  (1.727 m)   Wt 196 lb 9.6 oz (89.2 kg)   BMI 29.89 kg/m  Constitutional:  Well-developed, white female in no acute distress. Psychiatric: Normal mood and affect. Behavior is normal. HEENT: Normocephalic and atraumatic. Conjunctivae are normal. No scleral icterus. Neck supple.  Cardiovascular: Normal rate, regular rhythm.  Pulmonary/chest: Effort normal and breath sounds normal. No wheezing, rales or rhonchi. Abdominal: Soft, nondistended, nontender. Bowel sounds active throughout. There are no masses palpable. No hepatomegaly. Extremities: no edema Lymphadenopathy: No cervical adenopathy noted. Neurological: Alert and oriented to person place and time. Skin: Skin is warm and dry. No rashes noted.   ASSESSMENT AND PLAN:  41 year old several month history of multiple GI issues.including altered bowel habits / GERD [employer] /upper abdominal pain and right flank pain.  -GERD has responded to PPI which she should continue. GERD literature  given.  -Right flank pain radiates downward towards her right buttock. I suspect a musculoskeletal source of pain. She had a normal ultrasound and HIDA with EF. Plan is a trial of Clinoril and Robaxin daily at bedtime for 7-10 days -Citrucel fiber -Low gas diet  -Patient will return to clinic in 2-3 weeks for reevaluation.  Willette ClusterPaula Len Kluver, NP  08/06/2016, 8:43 AM  cc Porfirio OarJeffery, Chelle, PA-C  Agree with Ms. Gerica Koble's  assessment and plan. Does not seem GI in origin. Iva Booparl E. Gessner, MD, Clementeen GrahamFACG

## 2016-08-06 NOTE — Patient Instructions (Addendum)
Take Citrucell in water or juice daily.  [facility]  1. Robaxin, take 1 tab at bedtime.  2. Clinoril, take 1 tab every morning.   Continue Protonix.   We made you an appointment with Clover MealyPaula Guenter NP to follow up on 08-27-2016 at 8:30 am.

## 2016-08-27 ENCOUNTER — Ambulatory Visit (INDEPENDENT_AMBULATORY_CARE_PROVIDER_SITE_OTHER): Payer: BLUE CROSS/BLUE SHIELD | Admitting: Nurse Practitioner

## 2016-08-27 ENCOUNTER — Encounter: Payer: Self-pay | Admitting: Nurse Practitioner

## 2016-08-27 VITALS — BP 118/78 | HR 89 | Ht 68.0 in | Wt 196.2 lb

## 2016-08-27 DIAGNOSIS — K5909 Other constipation: Secondary | ICD-10-CM

## 2016-08-27 DIAGNOSIS — R1084 Generalized abdominal pain: Secondary | ICD-10-CM | POA: Diagnosis not present

## 2016-08-27 MED ORDER — HYOSCYAMINE SULFATE 0.125 MG SL SUBL: 0.1250 mg | SUBLINGUAL_TABLET | Freq: Three times a day (TID) | SUBLINGUAL | 0 refills | Status: DC | PRN

## 2016-08-27 NOTE — Patient Instructions (Addendum)
  Continue your pantoprazole one every other day for 2 weeks and then stop.   Continue your Citrucel.   Stop your clinoril and robaxin.   We have sent the following medications to your pharmacy for you to pick up at your convenience: Levsin SL  , if this is too expensive call us back.   Call us back in 2 weeks with a symptom update.    I appreciate the opportunity to care for you.

## 2016-08-27 NOTE — Progress Notes (Addendum)
     HPI: Patient is a female who I saw a couple weeks ago for right flank pain felt to be musculoskeletal in origin. She also described epigastric discomfort / belching / reflux / and altered bowel habits. The epigastric discomfort and belching had responded to PPI which was continued. U/S and HIDA were normal.  For the right flank pain she was given a trial of NSAIDs and muscle relaxers. Citrucel was started for altered bowel habits. She is here for follow up.   The right flank pain still comes and goes sometimes, difficult to know if the trial of NSAIDS  / muscle relaxers  helped. Bowels moving much better on Citrucel. She continues to complain of epigastric discomfort at night as "gas and stool are moving through intestines".  The pain is transient, lasts about 10 minutes, starts in the epigastrium and eventually moves to lower abdomen. Discomfort tends to signal that a bowel movement will follow in the a.m. No nausea or vomiting, no blood in stool. Weight is stable.    Past Medical History:  Diagnosis Date  . Chronic tension headaches   . Depression     Patient's surgical history, family medical history, social history, medications and allergies were all reviewed in Epic    Physical Exam: BP 118/78   Pulse 89   Ht 5\' 8"  (1.727 m)   Wt 196 lb 3.2 oz (89 kg)   SpO2 98%   BMI 29.83 kg/m   GENERAL: pleasant white female in NAD PSYCH: :Pleasant, cooperative, normal affect EENT:  Pupils equal, conjunctiva pink, mucous membranes moist, neck supple without masses CARDIAC:  RRR, no murmur heard, no peripheral edema PULM: Normal respiratory effort, lungs CTA bilaterally, no wheezing ABDOMEN:  soft, nontender, nondistended, no obvious masses, no hepatomegaly,  normal bowel sounds SKIN:  turgor, no lesions seen Musculoskeletal:  Normal  muscle tone, normal strength NEURO: Alert and oriented x 3, no focal neurologic deficits   ASSESSMENT and PLAN:  391. 41 year old female with several  recent GI issues : migrating abdominal pain at night (upper migrating to lower abd)  / belching / bowel changes.  Patient attributes pain to "gas and stool passing through intestines" and has improved with regulation of bowel habits on Citrucel. Belching better on PPI.  -Trial of sublingual Levsin as needed at night for intestinal cramping -Continue Citrucel -Ideally we could get her off PPI as she reports no history of significant GERD symptoms. She did describe some reflux symptoms as well as belching a few months back for which the PPI was started. Will wean off PPI over next couple of weeks and see how she does.   --Call me with a condition update in a couple of weeks. Will continue to try and pinpoint etiology of abdominal discomfort. For now, she looks okay, weight is stable, abdominal exam is unremarkable.  Recent U/S and HIDA negative.    2. Right flank pain, still believe this is musculoskeletal in origin. Better overall but still has it occasionally.   Willette ClusterPaula Mikah Poss , NP 08/27/2016, 9:03 AM   Agree with Ms. Dorice LamasGuenther's assessment and plan. Iva Booparl E. Gessner, MD, Clementeen GrahamFACG

## 2016-09-13 ENCOUNTER — Encounter: Payer: Self-pay | Admitting: Physician Assistant

## 2016-11-07 ENCOUNTER — Encounter: Payer: Self-pay | Admitting: Gynecology

## 2017-03-15 ENCOUNTER — Ambulatory Visit (INDEPENDENT_AMBULATORY_CARE_PROVIDER_SITE_OTHER): Payer: BLUE CROSS/BLUE SHIELD | Admitting: Physician Assistant

## 2017-03-15 ENCOUNTER — Encounter: Payer: Self-pay | Admitting: Physician Assistant

## 2017-03-15 VITALS — BP 114/81 | HR 91 | Resp 18 | Ht 67.75 in | Wt 195.2 lb

## 2017-03-15 DIAGNOSIS — Z13220 Encounter for screening for lipoid disorders: Secondary | ICD-10-CM

## 2017-03-15 DIAGNOSIS — Z1329 Encounter for screening for other suspected endocrine disorder: Secondary | ICD-10-CM

## 2017-03-15 DIAGNOSIS — Z1389 Encounter for screening for other disorder: Secondary | ICD-10-CM

## 2017-03-15 DIAGNOSIS — F4320 Adjustment disorder, unspecified: Secondary | ICD-10-CM

## 2017-03-15 DIAGNOSIS — F17200 Nicotine dependence, unspecified, uncomplicated: Secondary | ICD-10-CM

## 2017-03-15 DIAGNOSIS — Z23 Encounter for immunization: Secondary | ICD-10-CM

## 2017-03-15 DIAGNOSIS — Z6829 Body mass index (BMI) 29.0-29.9, adult: Secondary | ICD-10-CM

## 2017-03-15 DIAGNOSIS — Z13228 Encounter for screening for other metabolic disorders: Secondary | ICD-10-CM

## 2017-03-15 DIAGNOSIS — Z130 Encounter for screening for diseases of the blood and blood-forming organs and certain disorders involving the immune mechanism: Secondary | ICD-10-CM

## 2017-03-15 DIAGNOSIS — Z0000 Encounter for general adult medical examination without abnormal findings: Secondary | ICD-10-CM

## 2017-03-15 LAB — POCT URINALYSIS DIP (MANUAL ENTRY)
BILIRUBIN UA: NEGATIVE
Blood, UA: NEGATIVE
GLUCOSE UA: NEGATIVE mg/dL
Ketones, POC UA: NEGATIVE mg/dL
Leukocytes, UA: NEGATIVE
NITRITE UA: NEGATIVE
Protein Ur, POC: NEGATIVE mg/dL
Spec Grav, UA: <=1.005 — AB (ref 1.010–1.025)
UROBILINOGEN UA: 0.2 U/dL
pH, UA: 6 (ref 5.0–8.0)

## 2017-03-15 MED ORDER — BUPROPION HCL ER (XL) 150 MG PO TB24: 150.0000 mg | ORAL_TABLET | Freq: Every day | ORAL | 3 refills | Status: DC

## 2017-03-15 NOTE — Progress Notes (Signed)
Patient ID: Tanya Ingram, female    DOB: 10-30-1975, 41 y.o.   MRN: [id number]  PCP: Porfirio Oar, PA-C  Chief Complaint  Patient presents with  . Annual Exam    CPE with PAP   . Depression    Subjective:   Presents for [name].  In addition, she relates increased home stressors. Her husband doesn't contribute as much as she would like/need. She feels overwhelmed. Struggles to get her oldest son to school in the mornings because she often has a hard time getting out of bed herself. 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 When she does take time for herself, she feels guilty.  Cervical Cancer Screening: 02/18/2015, normal cytology Breast Cancer Screening: SBE, annual CBE. No previous mammogram. Colorectal Cancer Screening: not yet a candidate Bone Density Testing: not yet a candidate HIV Screening: NEGATIVE 02/18/2015 STI Screening: very low risk Seasonal Influenza Vaccination: today Td/Tdap Vaccination: today Pneumococcal Vaccination: not yet a candidate Zoster Vaccination: not yet a candidate Frequency of Dental evaluation: Q6 months Frequency of Eye evaluation: annually   Patient Active Problem List   Diagnosis Date Noted  . Smoker 03/15/2017  . BMI 29.0-29.9,adult 03/15/2017  . Abdominal pain 04/30/2016  . IUD (intrauterine device) in place 04/08/2015  . History of gestational diabetes mellitus 03/11/2015    Past Medical History:  Diagnosis Date  . Chronic tension headaches   . Depression      Prior to Admission medications   Medication Sig Start Date End Date Taking? Authorizing Provider  Aspirin-Acetaminophen-Caffeine (EXCEDRIN PO) Take 1 capsule by mouth as needed. 3-4 times weekly   Yes [provider]    Allergies  Allergen Reactions  . Nickel Rash  . Percocet [Oxycodone-Acetaminophen] Rash    Past Surgical  History:  Procedure Laterality Date  . DILATION AND CURETTAGE OF UTERUS    . EYE SURGERY  2000   Lasix    Family History  Problem Relation Age of Onset  . Gallstones Mother   . Stroke Maternal Grandmother   . Stroke Paternal Grandmother   . Diabetes Brother     Social History   Social History  . Marital status: Married    Spouse name: N/A  . Number of children: 2  . Years of education: N/A   Occupational History  . customer service [employer]   Social History Main Topics  . Smoking status: Former Smoker    Packs/day: 0.25    Years: 20.00    Types: Cigarettes  . Smokeless tobacco: Never Used     Comment: Switch between vaping and cigarettes   . Alcohol use 0.6 oz/week    1 Standard drinks or equivalent per week     Comment: socially  . Drug use: No  . Sexual activity: Yes    Birth control/ protection: IUD     Comment: INTERCOURSE AGE 38, SEXUAL PARTNERS MORE THAN 5   Other Topics Concern  . None   Social History Narrative   Lives here in [place] with her husband and two sons.       Review of Systems Constitutional: Positive for activity change (when depressed and stressed don't feel like getting out of bed), appetite change (when stressed and depressed don't eat) and fatigue. Negative for chills, fever and unexpected weight change.  HENT: Negative for congestion, ear pain, hearing loss, mouth sores, rhinorrhea, sore throat, tinnitus and trouble  swallowing.   Eyes: Negative for visual disturbance.  Respiratory: Negative for cough, chest tightness and shortness of breath.   Cardiovascular: Negative for chest pain, palpitations and leg swelling.  Gastrointestinal: Negative for abdominal pain, constipation, diarrhea, nausea and vomiting.  Endocrine: Negative for cold intolerance, heat intolerance, polydipsia and polyuria.  Genitourinary: Negative for dysuria.  Musculoskeletal: Negative for joint swelling, myalgias and neck stiffness.  Neurological: Positive for  headaches. Negative for dizziness and numbness.  Psychiatric/Behavioral: Positive for decreased concentration. The patient is nervous/anxious.       Objective:  Physical Exam  Constitutional: She is oriented to person, place, and time. Vital signs are normal. She appears well-developed and well-nourished. She is active and cooperative. No distress.  BP 114/81   Pulse 91   Resp 18   Ht 5' 7.75" (1.721 m)   Wt 195 lb 3.2 oz (88.5 kg)   SpO2 96%   BMI 29.90 kg/m    HENT:  Head: Normocephalic and atraumatic.  Right Ear: Hearing, tympanic membrane, external ear and ear canal normal. No foreign bodies.  Left Ear: Hearing, tympanic membrane, external ear and ear canal normal. No foreign bodies.  Nose: Nose normal.  Mouth/Throat: Uvula is midline, oropharynx is clear and moist and mucous membranes are normal. No oral lesions. Normal dentition. No dental abscesses or uvula swelling. No oropharyngeal exudate.  Eyes: Pupils are equal, round, and reactive to light. Conjunctivae, EOM and lids are normal. Right eye exhibits no discharge. Left eye exhibits no discharge. No scleral icterus.  Fundoscopic exam:      The right eye shows no arteriolar narrowing, no AV nicking, no exudate, no hemorrhage and no papilledema. The right eye shows red reflex.       The left eye shows no arteriolar narrowing, no AV nicking, no exudate, no hemorrhage and no papilledema. The left eye shows red reflex.  Neck: Trachea normal, normal range of motion and full passive range of motion without pain. Neck supple. No spinous process tenderness and no muscular tenderness present. No thyroid mass and no thyromegaly present.  Cardiovascular: Normal rate, regular rhythm, normal heart sounds, intact distal pulses and normal pulses.   Pulmonary/Chest: Effort normal and breath sounds normal. Right breast exhibits no inverted nipple, no mass, no nipple discharge, no skin change and no tenderness. Left breast exhibits no inverted  nipple, no mass, no nipple discharge, no skin change and no tenderness. Breasts are symmetrical.  Musculoskeletal: She exhibits no edema or tenderness.       Cervical back: Normal.       Thoracic back: Normal.       Lumbar back: Normal.  Lymphadenopathy:       Head (right side): No tonsillar, no preauricular, no posterior auricular and no occipital adenopathy present.       Head (left side): No tonsillar, no preauricular, no posterior auricular and no occipital adenopathy present.    She has no cervical adenopathy.       Right: No supraclavicular adenopathy present.       Left: No supraclavicular adenopathy present.  Neurological: She is alert and oriented to person, place, and time. She has normal strength and normal reflexes. No cranial nerve deficit. She exhibits normal muscle tone. Coordination and gait normal.  Skin: Skin is warm, dry and intact. No rash noted. She is not diaphoretic. No cyanosis or erythema. Nails show no clubbing.  Psychiatric: She has a normal mood and affect. Her speech is normal and behavior is normal. Judgment and  thought content normal.           Assessment & Plan:   Problem List Items Addressed This Visit    Smoker    Counseled on smoking cessation.       BMI 29.0-29.9,adult    Healthy lifestyle changes.       Other Visit Diagnoses    Annual physical exam    -  Primary   Age appropriate health guidance provided.   Need for prophylactic vaccination and inoculation against influenza       Relevant Orders   Flu Vaccine QUAD 36+ mos IM (Completed)   Need for Tdap vaccination       Relevant Orders   Tdap vaccine greater than or equal to 7yo IM (Completed)   Adult situational stress disorder       Relevant Medications   buPROPion (WELLBUTRIN XL) 150 MG 24 hr tablet   Screening for deficiency anemia       Relevant Orders   CBC with Differential/Platelet (Completed)   Screening for metabolic disorder       Relevant Orders   Comprehensive  metabolic panel (Completed)   Screening for thyroid disorder       Relevant Orders   TSH (Completed)   Screening for blood or protein in urine       Relevant Orders   POCT urinalysis dipstick (Completed)   Screening for hyperlipidemia       Relevant Orders   Lipid panel (Completed)       Return in about 4 weeks (around 04/12/2017) for re-evalaution of mood.   Fernande Bras, PA-C [facility]

## 2017-03-15 NOTE — Patient Instructions (Addendum)
IF you received an x-ray today, you will receive an invoice from [facility]. Please contact [facility] at [phone] with questions or concerns regarding your invoice.   IF you received labwork today, you will receive an invoice from [facility]. Please contact [facility] at [phone] with questions or concerns regarding your invoice.   Our billing staff will not be able to assist you with questions regarding bills from these companies.  You will be contacted with the lab results as soon as they are available. The fastest way to get your results is to activate your My Chart account. Instructions are located on the last page of this paperwork. If you have not heard from [place] regarding the results in 2 weeks, please contact this office.     Did you know that you begin to benefit from quitting smoking within the first twenty minutes? It's TRUE.  At 20 minutes: -blood pressure decreases -pulse rate drops -body temperature of hands and feet increases  At 8 hours: -carbon monoxide level in blood drops to normal -oxygen level in blood increases to normal  At 24 hours: -the chance of heart attack decreases  At 48 hours: -nerve endings start regrowing -ability to smell and taste is enhanced  2 weeks-3 months: -circulation improves -walking becomes easier -lung function improves  1-9 months: -coughing, sinus congestion, fatigue and shortness of breath decreases  1 year: -excess risk of heart disease is decreased to HALF that of a smoker  5 years: Stroke risk is reduced to that of people who have never smoked  10 years: -risk of lung cancer drops to as little as half that of continuing smokers -risk of cancer of the mouth, throat, esophagus, bladder, kidney and pancreas decreases -risk of ulcer decreases  15 years -risk of heart disease is now similar to that of people who have never smoked -risk of death returns to nearly the level of people who have  never smoked  Preventive Care 49-64 Years, Female Preventive care refers to lifestyle choices and visits with your health care provider that can promote health and wellness. What does preventive care include?  A yearly physical exam. This is also called an annual well check.  Dental exams once or twice a year.  Routine eye exams. Ask your health care provider how often you should have your eyes checked.  Personal lifestyle choices, including: ? Daily care of your teeth and gums. ? Regular physical activity. ? Eating a healthy diet. ? Avoiding tobacco and drug use. ? Limiting alcohol use. ? Practicing safe sex. ? Taking low-dose aspirin daily starting at age 43. ? Taking vitamin and mineral supplements as recommended by your health care provider. What happens during an annual well check? The services and screenings done by your health care provider during your annual well check will depend on your age, overall health, lifestyle risk factors, and family history of disease. Counseling Your health care provider may ask you questions about your:  Alcohol use.  Tobacco use.  Drug use.  Emotional well-being.  Home and relationship well-being.  Sexual activity.  Eating habits.  Work and work Statistician.  Method of birth control.  Menstrual cycle.  Pregnancy history.  Screening You may have the following tests or measurements:  Height, weight, and BMI.  Blood pressure.  Lipid and cholesterol levels. These may be checked every 5 years, or more frequently if you are over 55 years old.  Skin check.  Lung cancer screening. You may have this  screening every year starting at age 56 if you have a 30-pack-year history of smoking and currently smoke or have quit within the past 15 years.  Fecal occult blood test (FOBT) of the stool. You may have this test every year starting at age 20.  Flexible sigmoidoscopy or colonoscopy. You may have a sigmoidoscopy every 5 years or  a colonoscopy every 10 years starting at age 32.  Hepatitis C blood test.  Hepatitis B blood test.  Sexually transmitted disease (STD) testing.  Diabetes screening. This is done by checking your blood sugar (glucose) after you have not eaten for a while (fasting). You may have this done every 1-3 years.  Mammogram. This may be done every 1-2 years. Talk to your health care provider about when you should start having regular mammograms. This may depend on whether you have a family history of breast cancer.  BRCA-related cancer screening. This may be done if you have a family history of breast, ovarian, tubal, or peritoneal cancers.  Pelvic exam and Pap test. This may be done every 3 years starting at age 69. Starting at age 21, this may be done every 5 years if you have a Pap test in combination with an HPV test.  Bone density scan. This is done to screen for osteoporosis. You may have this scan if you are at high risk for osteoporosis.  Discuss your test results, treatment options, and if necessary, the need for more tests with your health care provider. Vaccines Your health care provider may recommend certain vaccines, such as:  Influenza vaccine. This is recommended every year.  Tetanus, diphtheria, and acellular pertussis (Tdap, Td) vaccine. You may need a Td booster every 10 years.  Varicella vaccine. You may need this if you have not been vaccinated.  Zoster vaccine. You may need this after age 70.  Measles, mumps, and rubella (MMR) vaccine. You may need at least one dose of MMR if you were born in 1957 or later. You may also need a second dose.  Pneumococcal 13-valent conjugate (PCV13) vaccine. You may need this if you have certain conditions and were not previously vaccinated.  Pneumococcal polysaccharide (PPSV23) vaccine. You may need one or two doses if you smoke cigarettes or if you have certain conditions.  Meningococcal vaccine. You may need this if you have certain  conditions.  Hepatitis A vaccine. You may need this if you have certain conditions or if you travel or work in places where you may be exposed to hepatitis A.  Hepatitis B vaccine. You may need this if you have certain conditions or if you travel or work in places where you may be exposed to hepatitis B.  Haemophilus influenzae type b (Hib) vaccine. You may need this if you have certain conditions.  Talk to your health care provider about which screenings and vaccines you need and how often you need them. This information is not intended to replace advice given to you by your health care provider. Make sure you discuss any questions you have with your health care provider. Document Released: 07/08/2015 Document Revised: 02/29/2016 Document Reviewed: 04/12/2015 Elsevier Interactive Patient Education  2017 [employer].

## 2017-03-15 NOTE — Progress Notes (Signed)
Subjective:    Patient ID: Tanya Ingram, female    DOB: 1976-01-16, 41 y.o.   MRN: [id number]  HPI Patient reports having increased stress from work and home life. She feels "stressed and overwhelmed". She does not feel she gets enough help from her husband. She states she has to drop her oldest son at a school and has had trouble getting up in the morning to do so. 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  Reports some days she "cannot get out of bed in the morning and just wants to sleep." She does not have time for her own hobbies, and if she does she will feel guilty about it.   Review of Systems  Constitutional: Positive for activity change (when depressed and stressed don't feel like getting out of bed), appetite change (when stressed and depressed don't eat) and fatigue. Negative for chills, fever and unexpected weight change.  HENT: Negative for congestion, ear pain, hearing loss, mouth sores, rhinorrhea, sore throat, tinnitus and trouble swallowing.   Eyes: Negative for visual disturbance.  Respiratory: Negative for cough, chest tightness and shortness of breath.   Cardiovascular: Negative for chest pain, palpitations and leg swelling.  Gastrointestinal: Negative for abdominal pain, constipation, diarrhea, nausea and vomiting.  Endocrine: Negative for cold intolerance, heat intolerance, polydipsia and polyuria.  Genitourinary: Negative for dysuria.  Musculoskeletal: Negative for joint swelling, myalgias and neck stiffness.  Neurological: Positive for headaches. Negative for dizziness and numbness.  Psychiatric/Behavioral: Positive for decreased concentration. The patient is nervous/anxious.    Prior to Admission medications   Medication Sig Start Date End Date Taking? Authorizing Provider  Aspirin-Acetaminophen-Caffeine (EXCEDRIN PO) Take 1 capsule by mouth as needed. 3-4 times weekly   Yes [provider]  buPROPion (WELLBUTRIN XL) 150 MG  24 hr tablet Take 1 tablet (150 mg total) by mouth daily. 03/15/17   Porfirio Oar, PA-C   Allergies  Allergen Reactions  . Nickel Rash  . Percocet [Oxycodone-Acetaminophen] Rash   Social History   Social History  . Marital status: Married    Spouse name: N/A  . Number of children: 2  . Years of education: N/A   Occupational History  . customer service [employer]   Social History Main Topics  . Smoking status: Former Smoker    Packs/day: 0.25    Years: 20.00    Types: Cigarettes  . Smokeless tobacco: Never Used     Comment: Switch between vaping and cigarettes   . Alcohol use 0.6 oz/week    1 Standard drinks or equivalent per week     Comment: socially  . Drug use: No  . Sexual activity: Yes    Birth control/ protection: IUD     Comment: INTERCOURSE AGE 75, SEXUAL PARTNERS MORE THAN 5   Other Topics Concern  . Not on file   Social History Narrative   Lives here in [place] with her husband and two sons.   Patient Active Problem List   Diagnosis Date Noted  . Smoker 03/15/2017  . BMI 29.0-29.9,adult 03/15/2017  . Abdominal pain 04/30/2016  . IUD (intrauterine device) in place 04/08/2015  . History of gestational diabetes mellitus 03/11/2015       Objective:   Physical Exam  Constitutional: She is oriented to person, place, and time. She appears well-developed and well-nourished.  HENT:  Head: Normocephalic.  Right Ear: External ear normal.  Left Ear: External ear normal.  Eyes: Pupils are equal, round, and reactive to  light.  Cardiovascular: Normal rate, regular rhythm, normal heart sounds and intact distal pulses.   Pulmonary/Chest: Effort normal and breath sounds normal.  Abdominal: Soft. Bowel sounds are normal. There is tenderness (epigastric, suprapubic ).  Neurological: She is alert and oriented to person, place, and time. She has normal reflexes.  Skin: Skin is warm and dry.      Assessment & Plan:  1. Annual physical exam  2. Need for  prophylactic vaccination and inoculation against influenza - Flu Vaccine QUAD 36+ mos IM  3. Need for Tdap vaccination - Tdap vaccine greater than or equal to 7yo IM  4. Adult situational stress disorder 5. Smoker - buPROPion (WELLBUTRIN XL) 150 MG 24 hr tablet; Take 1 tablet (150 mg total) by mouth daily.  Dispense: 90 tablet; Refill: 3  6. BMI 29.0-29.9,adult - Encouraged to avoid frozen meals and to increase fresh fruits and vegetables in diet. - Instructed patient to try walking to get more exercise and as a way to release stress when she feels overwhelmed from work and home life.   7. Screening for deficiency anemia - CBC with Differential/Platelet  8. Screening for metabolic disorder - Comprehensive metabolic panel  9. Screening for thyroid disorder - TSH  10. Screening for blood or protein in urine - POCT urinalysis dipstick  11. Screening for hyperlipidemia - Lipid panel  Respectfully, Gala Romney PA-S 2019

## 2017-03-16 LAB — CBC WITH DIFFERENTIAL/PLATELET
BASOS: 1 %
Basophils Absolute: 0.1 10*3/uL (ref 0.0–0.2)
EOS (ABSOLUTE): 0.3 10*3/uL (ref 0.0–0.4)
Eos: 5 %
HEMATOCRIT: 37.3 % (ref 34.0–46.6)
HEMOGLOBIN: 13 g/dL (ref 11.1–15.9)
Immature Grans (Abs): 0 10*3/uL (ref 0.0–0.1)
Immature Granulocytes: 0 %
LYMPHS ABS: 2.7 10*3/uL (ref 0.7–3.1)
LYMPHS: 38 %
MCH: 30.5 pg (ref 26.6–33.0)
MCHC: 34.9 g/dL (ref 31.5–35.7)
MCV: 88 fL (ref 79–97)
MONOS ABS: 0.3 10*3/uL (ref 0.1–0.9)
Monocytes: 4 %
NEUTROS ABS: 3.8 10*3/uL (ref 1.4–7.0)
Neutrophils: 52 %
Platelets: 297 10*3/uL (ref 150–379)
RBC: 4.26 x10E6/uL (ref 3.77–5.28)
RDW: 13.7 % (ref 12.3–15.4)
WBC: 7.2 10*3/uL (ref 3.4–10.8)

## 2017-03-16 LAB — COMPREHENSIVE METABOLIC PANEL
A/G RATIO: 2.6 — AB (ref 1.2–2.2)
ALBUMIN: 4.5 g/dL (ref 3.5–5.5)
ALK PHOS: 89 IU/L (ref 39–117)
ALT: 12 IU/L (ref 0–32)
AST: 15 IU/L (ref 0–40)
BUN / CREAT RATIO: 14 (ref 9–23)
BUN: 10 mg/dL (ref 6–24)
Bilirubin Total: 0.6 mg/dL (ref 0.0–1.2)
CO2: 20 mmol/L (ref 20–29)
CREATININE: 0.69 mg/dL (ref 0.57–1.00)
Calcium: 9.3 mg/dL (ref 8.7–10.2)
Chloride: 103 mmol/L (ref 96–106)
GFR calc Af Amer: 125 mL/min/{1.73_m2} (ref >59)
GFR, EST NON AFRICAN AMERICAN: 108 mL/min/{1.73_m2} (ref >59)
GLOBULIN, TOTAL: 1.7 g/dL (ref 1.5–4.5)
Glucose: 81 mg/dL (ref 65–99)
POTASSIUM: 3.8 mmol/L (ref 3.5–5.2)
SODIUM: 139 mmol/L (ref 134–144)
Total Protein: 6.2 g/dL (ref 6.0–8.5)

## 2017-03-16 LAB — LIPID PANEL
Chol/HDL Ratio: 3.8 ratio (ref 0.0–4.4)
Cholesterol, Total: 162 mg/dL (ref 100–199)
HDL: 43 mg/dL (ref >39)
LDL CALC: 99 mg/dL (ref 0–99)
Triglycerides: 100 mg/dL (ref 0–149)
VLDL CHOLESTEROL CAL: 20 mg/dL (ref 5–40)

## 2017-03-16 LAB — TSH: TSH: 1.42 u[IU]/mL (ref 0.450–4.500)

## 2017-03-24 NOTE — Assessment & Plan Note (Signed)
Healthy lifestyle changes. 

## 2017-03-24 NOTE — Assessment & Plan Note (Signed)
Counseled on smoking cessation  

## 2017-04-12 ENCOUNTER — Ambulatory Visit (INDEPENDENT_AMBULATORY_CARE_PROVIDER_SITE_OTHER): Payer: BLUE CROSS/BLUE SHIELD | Admitting: Physician Assistant

## 2017-04-12 ENCOUNTER — Encounter: Payer: Self-pay | Admitting: Physician Assistant

## 2017-04-12 VITALS — BP 118/78 | HR 112 | Temp 98.8°F | Resp 18 | Ht 67.75 in | Wt 200.0 lb

## 2017-04-12 DIAGNOSIS — F4320 Adjustment disorder, unspecified: Secondary | ICD-10-CM | POA: Diagnosis not present

## 2017-04-12 MED ORDER — BUPROPION HCL ER (XL) 300 MG PO TB24: 300.0000 mg | ORAL_TABLET | Freq: Every day | ORAL | 3 refills | Status: DC

## 2017-04-12 NOTE — Progress Notes (Signed)
   Subjective:    Patient ID: Tanya Ingram, female    DOB: 04/28/1976, 41 y.o.   MRN: [id number]  HPI Tanya Ingram is a 41 year old Caucasian female with a past medical history of depression who presents for depression follow-up. She was started on Wellbutrin XL 150mg  last month. Tanya Ingram reports she still feels kind of down and does not feel any different since starting the Wellbutrin. Her family is starting to rally around her and trying to help. Her husband is starting to be more supportive around the house and her mother is making sure she is taking care of herself. She just still feels like she cannot go on and do her day to day activities. She has trouble getting out of bed in the morning and dreads going to work. 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 Tanya Ingram reports she constantly gets called a "bitch" by the customers which is wearing on her. She knows she is very good at her job, she likes her team, company and she feels valued. She recently got a raise which helps. Tanya Ingram has a lot of responsibility at home with her 6410 and 41 year old sons.   She has had a a few thoughts of harming herself but does not want to do that her children, husband or family. She states she is usually not an emotional person and is really "wheepy". She has thought about seeing a counselor, but is worried about not finding the time.  Tanya Ingram recently started taking a B-complex vitamin and beta-carotene. She states she drinks 2-3 glasses of wine per week with an occasional shot of liquor. She also smokes 3/4 packs of cigarettes per day. She reports she has been trying to quit. Tanya Ingram states she smokes marijuana occasionally socially 1-2 times a month.  Tanya Ingram also reports a history of chronic tension headaches. She states she gets a headache 2-3 times per week that is usually controlled with Excedrin migraine. Tanya Ingram states that she woke up with a migraine last week. She reports she had  photophobia and phonophobia. She also had pain down the left sie of her neck. Tanya Ingram also reports having nausea and vomiting. Her symptoms resolved with Tylenol.  Review of Systems     Objective:   Physical Exam  Constitutional: She appears well-developed and well-nourished. She is cooperative.  BP 118/78 (BP Location: Left Arm, Patient Position: Sitting, Cuff Size: Normal)   Pulse (!) 112   Temp 98.8 F (37.1 C) (Oral)   Resp 18   Ht 5' 7.75" (1.721 m)   Wt 200 lb (90.7 kg)   SpO2 96%   BMI 30.63 kg/m    HENT:  Head: Normocephalic and atraumatic.  Neck: Normal range of motion. Neck supple. No thyromegaly present.  Cardiovascular: Normal rate, regular rhythm, normal heart sounds and intact distal pulses.   No murmur heard. Pulmonary/Chest: Effort normal and breath sounds normal.  Lymphadenopathy:    She has no cervical adenopathy.  Neurological: She is alert.  Psychiatric: Her behavior is normal.  Depressed affect. Crying in exam room.       Assessment & Plan:  1. Depression  - Increase Wellbutrin XL to 300mg  daily  - Discussed referral to psychologist. Patient is interested, but worried about scheduling conflicts  - Encouraged patient to incorporate more exercise or yoga into her daily routine.  - Follow-up in 4 weeks for depression follow-up  Thresea Doble, PA-S

## 2017-04-12 NOTE — Progress Notes (Signed)
Patient ID: Tanya Ingram, female    DOB: Apr 19, 1976, 41 y.o.   MRN: [id number]  PCP: Porfirio Oar, PA-C  Chief Complaint  Patient presents with  . Mood    Depression scale score 8, Pt states she think the Wellbutrin XL has made much of a difference. She states she feels "bla"  . Follow-up    Subjective:   Presents for evaluation of depression since starting bupropion.  Bupropion was started 9/21 for situational stress, anxiety and depression. Still feels down, no difference since starting treatment. Family is more supportive and helpful. Work is stressful, customers often mean and inappropriate, though she likes her team and knows that she is good at her job. More emotional/weepy than usual. Concerned that she doesn't have time to see a counselor, especially going in to the holiday season. Her mother has been helping, and her husband and sons have been more helpful around the house, which is good.    Review of Systems As above.  Depression screen [facility] 2/9 04/12/2017 03/15/2017 04/30/2016 09/22/2015 09/20/2015  Decreased Interest 1 3 0 0 0  Down, Depressed, Hopeless 1 3 0 0 0  PHQ - 2 Score 2 6 0 0 0  Altered sleeping 1 2 - - -  Tired, decreased energy 2 2 - - -  Change in appetite 0 3 - - -  Feeling bad or failure about yourself  2 0 - - -  Trouble concentrating 1 2 - - -  Moving slowly or fidgety/restless 0 0 - - -  Suicidal thoughts 0 0 - - -  PHQ-9 Score 8 15 - - -  Difficult doing work/chores Very difficult - - - -     Patient Active Problem List   Diagnosis Date Noted  . Smoker 03/15/2017  . BMI 29.0-29.9,adult 03/15/2017  . Abdominal pain 04/30/2016  . IUD (intrauterine device) in place 04/08/2015  . History of gestational diabetes mellitus 03/11/2015     Prior to Admission medications   Medication Sig Start Date End Date Taking? Authorizing Provider  Aspirin-Acetaminophen-Caffeine (EXCEDRIN PO) Take 1 capsule by mouth as needed. 3-4 times weekly    Yes [provider]  buPROPion (WELLBUTRIN XL) 150 MG 24 hr tablet Take 1 tablet (150 mg total) by mouth daily. 03/15/17  Yes Porfirio Oar, PA-C     Allergies  Allergen Reactions  . Nickel Rash  . Percocet [Oxycodone-Acetaminophen] Rash       Objective:  Physical Exam  Constitutional: She is oriented to person, place, and time. She appears well-developed and well-nourished. She is active and cooperative. No distress.  BP 118/78 (BP Location: Left Arm, Patient Position: Sitting, Cuff Size: Normal)   Pulse (!) 112   Temp 98.8 F (37.1 C) (Oral)   Resp 18   Ht 5' 7.75" (1.721 m)   Wt 200 lb (90.7 kg)   SpO2 96%   BMI 30.63 kg/m    Eyes: Conjunctivae are normal.  Pulmonary/Chest: Effort normal.  Neurological: She is alert and oriented to person, place, and time.  Psychiatric: Her speech is normal and behavior is normal. Judgment and thought content normal. Her mood appears not anxious. Her affect is angry and labile. Her affect is not blunt and not inappropriate. Cognition and memory are normal. She exhibits a depressed mood.   Problem List Items Addressed This Visit    Adult situational stress disorder - Primary    INCREASE bupropion xl from 150 mg to 300 mg. Recommend counseling  by herself, as her husband is not willing to go.      Relevant Medications   buPROPion (WELLBUTRIN XL) 300 MG 24 hr tablet       Return in about 4 weeks (around 05/10/2017) for re-evaluation of mood.   Fernande Brashelle S. Larrie Fraizer, PA-C Primary Care at [facility]     Assessment & Plan:

## 2017-04-12 NOTE — Patient Instructions (Addendum)
Use up the bupropion (Wellbutrin) that you have by taking 2 together daily. Then fill the new prescription for the 300 mg capsules.  For therapy -- [facility] ([address] Amato[place]inc[address], Ernest [place], [address]Josiane Labine Joycelyn SchmidKitchens, Karla [place]) - [phone] [facility] [address](Julie Jerilynn MagesWhitt, Terri Franklin[place]auert) - [phone] [facility] - [phone] [facility] - [phone] Buena IrishBob Mylan - [phone] Jeanella FlatterySarah Young - [facility], [phone]  [facility]  - [phone] (do not file insurance)        IF you received an x-ray today, you will receive an invoice from [facility]. Please contact [facility] at [phone] with questions or concerns regarding your invoice.   IF you received labwork today, you will receive an invoice from [facility]. Please contact [facility] at [phone] with questions or concerns regarding your invoice.   Our billing staff will not be able to assist you with questions regarding bills from these companies.  You will be contacted with the lab results as soon as they are available. The fastest way to get your results is to activate your My Chart account. Instructions are located on the last page of this paperwork. If you have not heard from us regarding the results in 2 weeks, please contact this office.

## 2017-04-15 DIAGNOSIS — F4320 Adjustment disorder, unspecified: Secondary | ICD-10-CM | POA: Insufficient documentation

## 2017-04-15 NOTE — Assessment & Plan Note (Signed)
INCREASE bupropion xl from 150 mg to 300 mg. Recommend counseling by herself, as her husband is not willing to go.

## 2017-05-21 ENCOUNTER — Ambulatory Visit: Payer: BLUE CROSS/BLUE SHIELD | Admitting: Physician Assistant

## 2017-07-02 ENCOUNTER — Telehealth: Payer: Self-pay | Admitting: Physician Assistant

## 2017-07-02 NOTE — Telephone Encounter (Signed)
Left message per DPR to remind pt of appt tomorrow 07/03/16

## 2017-07-03 ENCOUNTER — Ambulatory Visit (INDEPENDENT_AMBULATORY_CARE_PROVIDER_SITE_OTHER): Payer: BLUE CROSS/BLUE SHIELD | Admitting: Physician Assistant

## 2017-07-03 ENCOUNTER — Encounter: Payer: Self-pay | Admitting: Physician Assistant

## 2017-07-03 ENCOUNTER — Other Ambulatory Visit: Payer: Self-pay

## 2017-07-03 DIAGNOSIS — F4320 Adjustment disorder, unspecified: Secondary | ICD-10-CM | POA: Diagnosis not present

## 2017-07-03 NOTE — Patient Instructions (Signed)
     IF you received an x-ray today, you will receive an invoice from [facility]. Please contact [facility] at [phone] with questions or concerns regarding your invoice.   IF you received labwork today, you will receive an invoice from [facility]. Please contact [facility] at [phone] with questions or concerns regarding your invoice.   Our billing staff will not be able to assist you with questions regarding bills from these companies.  You will be contacted with the lab results as soon as they are available. The fastest way to get your results is to activate your My Chart account. Instructions are located on the last page of this paperwork. If you have not heard from us regarding the results in 2 weeks, please contact this office.     

## 2017-07-03 NOTE — Progress Notes (Signed)
Patient ID: Tanya Ingram, female    DOB: 09/27/1975, 42 y.o.   MRN: [id number]  PCP: Porfirio OarJeffery, Tracye Szuch, PA-C  Chief Complaint  Patient presents with  . Medication Management    Pt states she doesn't think the Wellbutrin is for her. Pt states she doesn't feel any difference.  . Depression    Depression scale 6    Subjective:   Presents for evaluation of situational stress.  She has noticed no improvement with the addition, and then increased dose, of bupropion. As such, she stopped it in November.  Has made some life changes at home, reducing her own responsibilities, specifically in the area of emotional labor performed by so many wives/mothers who also work. Her family now knows that if the dishes aren't done, such that she has room to prepare the next meal, she simply won't cook. Her husband is now responsible for scheduling his own healthcare visits.  She works for [employer], in Clinical biochemistcustomer service. This past peak season was very difficult, but enabled her to bank some time off. Unfortunately, she finds herself using it up taking her children to school. Her husband's job hours do not allow him to help her with this. Is in need of some time for self-care, and would like to pursue FMLA, 1/2 day (4 hours) up to twice a month.    Review of Systems No CP, SOB, dizziness. No nausea, vomiting, diarrhea.  [facility] 07/03/2017 04/12/2017 03/15/2017 04/30/2016 09/22/2015  Decreased Interest 1 1 3  0 0  Down, Depressed, Hopeless 1 1 3  0 0  PHQ - 2 Score 2 2 6  0 0  Altered sleeping 1 1 2  - -  Tired, decreased energy 1 2 2  - -  Change in appetite 0 0 3 - -  Feeling bad or failure about yourself  1 2 0 - -  Trouble concentrating 1 1 2  - -  Moving slowly or fidgety/restless 0 0 0 - -  Suicidal thoughts 0 0 0 - -  PHQ-9 Score 6 8 15  - -  Difficult doing work/chores Somewhat difficult Very difficult - - -       Patient Active Problem List   Diagnosis Date Noted  . Adult  situational stress disorder 04/15/2017  . Smoker 03/15/2017  . BMI 29.0-29.9,adult 03/15/2017  . Abdominal pain 04/30/2016  . IUD (intrauterine device) in place 04/08/2015  . History of gestational diabetes mellitus 03/11/2015     Prior to Admission medications   Medication Sig Start Date End Date Taking? Authorizing Provider  Aspirin-Acetaminophen-Caffeine (EXCEDRIN PO) Take 1 capsule by mouth as needed. 3-4 times weekly   Yes [provider]  buPROPion (WELLBUTRIN XL) 300 MG 24 hr tablet Take 1 tablet (300 mg total) by mouth daily. 04/12/17  Yes Porfirio OarJeffery, George Haggart, PA-C     Allergies  Allergen Reactions  . Nickel Rash  . Percocet [Oxycodone-Acetaminophen] Rash       Objective:  Physical Exam  Constitutional: She is oriented to person, place, and time. She appears well-developed and well-nourished. She is active and cooperative. No distress.  BP 118/60 (BP Location: Left Arm, Patient Position: Sitting, Cuff Size: Large)   Pulse (!) 102   Temp 98.1 F (36.7 C) (Oral)   Resp 18   Ht 5' 7.5" (1.715 m)   Wt 201 lb 12.8 oz (91.5 kg)   SpO2 99%   BMI 31.14 kg/m    Eyes: Conjunctivae are normal.  Pulmonary/Chest: Effort normal.  Neurological:  She is alert and oriented to person, place, and time.  Psychiatric: She has a normal mood and affect. Her speech is normal and behavior is normal.       Assessment & Plan:   Problem List Items Addressed This Visit    Adult situational stress disorder    No benefit from bupropion. Excellent changes at home, off-loading chores and letting go of emotional labor. I support her request for up to 2 half-days of time for self care through [facility]. She will bring the forms for me to complete.          Return in about 2 months (around 08/31/2017) for re evaluation of mood.   Fernande Bras, PA-C Primary Care at [facility]

## 2017-07-08 ENCOUNTER — Encounter: Payer: Self-pay | Admitting: Physician Assistant

## 2017-07-08 NOTE — Telephone Encounter (Signed)
Form printed at 102. Please put in my box for completion when I am at workin on Tuesday.

## 2017-07-08 NOTE — Assessment & Plan Note (Signed)
No benefit from bupropion. Excellent changes at home, off-loading chores and letting go of emotional labor. I support her request for up to 2 half-days of time for self care through [facility]. She will bring the forms for me to complete.

## 2017-07-13 NOTE — Telephone Encounter (Signed)
Form completed. At [address] building for pick up. Patient notified by My Chart.

## 2017-07-22 ENCOUNTER — Telehealth: Payer: Self-pay | Admitting: Physician Assistant

## 2017-07-22 NOTE — Telephone Encounter (Signed)
Herbert SetaHeather brought these forms to me on Friday 07/19/17 to be completed by Chelle for the patients FMLA. After reading the message in her chart it looks like Chelle has already complted these forms but there is no records of them in the patients chart and I did not see them at 102. I will place the blank forms in Chelle's box on 07/22/17 please return to the FMLA/Disability desk within 5-7 business days. Thank you!

## 2017-07-23 NOTE — Telephone Encounter (Signed)
Form completed and returned to FMLA/disability desk 

## 2017-07-24 NOTE — Telephone Encounter (Signed)
Paperwork scanned and faxed on 07/24/17 °

## 2017-07-31 ENCOUNTER — Telehealth: Payer: Self-pay | Admitting: Physician Assistant

## 2017-07-31 NOTE — Telephone Encounter (Signed)
Called pt to let her know we need to reschedule her appt with Porfirio Oarhelle Jeffery 08/30/17. Chelle will not be in the office that day so when pt calls in, please schedule her for any other day   Thanks!

## 2017-08-30 ENCOUNTER — Ambulatory Visit: Payer: BLUE CROSS/BLUE SHIELD | Admitting: Physician Assistant

## 2017-09-18 ENCOUNTER — Encounter: Payer: Self-pay | Admitting: Physician Assistant

## 2017-09-26 ENCOUNTER — Encounter: Payer: Self-pay | Admitting: Physician Assistant

## 2017-10-21 ENCOUNTER — Other Ambulatory Visit: Payer: Self-pay

## 2017-10-21 ENCOUNTER — Ambulatory Visit (INDEPENDENT_AMBULATORY_CARE_PROVIDER_SITE_OTHER): Payer: BLUE CROSS/BLUE SHIELD | Admitting: Family Medicine

## 2017-10-21 ENCOUNTER — Encounter: Payer: Self-pay | Admitting: Family Medicine

## 2017-10-21 ENCOUNTER — Ambulatory Visit (INDEPENDENT_AMBULATORY_CARE_PROVIDER_SITE_OTHER): Payer: BLUE CROSS/BLUE SHIELD

## 2017-10-21 VITALS — BP 94/60 | HR 82 | Temp 98.0°F | Ht 68.0 in | Wt 204.0 lb

## 2017-10-21 DIAGNOSIS — M25561 Pain in right knee: Secondary | ICD-10-CM | POA: Diagnosis not present

## 2017-10-21 DIAGNOSIS — S80211A Abrasion, right knee, initial encounter: Secondary | ICD-10-CM

## 2017-10-21 DIAGNOSIS — M25572 Pain in left ankle and joints of left foot: Secondary | ICD-10-CM | POA: Diagnosis not present

## 2017-10-21 DIAGNOSIS — S93402A Sprain of unspecified ligament of left ankle, initial encounter: Secondary | ICD-10-CM

## 2017-10-21 DIAGNOSIS — S8001XA Contusion of right knee, initial encounter: Secondary | ICD-10-CM | POA: Diagnosis not present

## 2017-10-21 NOTE — Patient Instructions (Addendum)
Ankle x-ray does not indicate any fracture.  It appears to be a lateral ankle sprain as we discussed.  Okay to wear the brace as needed for now, with range of motion out of the brace twice per day initially.  See handout below on ankle sprain and initial rehab as your symptoms improve.  Tylenol as needed, other symptomatic care as discussed below.  Recheck ankle in the next 2 weeks, sooner if needed.  Right knee pain appears to be due to a contusion.  See information below night condition.  For the abrasion I also included information below, but keep the area clean with soap and water cleansing twice per day, Polysporin or triple antibiotic ointment if needed.  Watch for signs of infection as we discussed. Return to the clinic or go to the nearest emergency room if any of your symptoms worsen or new symptoms occur.  Thank you for coming in today.   Ankle Sprain An ankle sprain is a stretch or tear in one of the tough, fiber-like tissues (ligaments) in the ankle. The ligaments in your ankle help to hold the bones of the ankle together. What are the causes? This condition is often caused by stepping on or falling on the outer edge of the foot. What increases the risk? This condition is more likely to develop in people who play sports. What are the signs or symptoms? Symptoms of this condition include:  Pain in your ankle.  Swelling.  Bruising. Bruising may develop right after you sprain your ankle or 1-2 days later.  Trouble standing or walking, especially when you turn or change directions.  How is this diagnosed? This condition is diagnosed with a physical exam. During the exam, your health care provider will press on certain parts of your foot and ankle and try to move them in certain ways. X-rays may be taken to see how severe the sprain is and to check for broken bones. How is this treated? This condition may be treated with:  A brace. This is used to keep the ankle from moving  until it heals.  An elastic bandage. This is used to support the ankle.  Crutches.  Pain medicine.  Surgery. This may be needed if the sprain is severe.  Physical therapy. This may help to improve the range of motion in the ankle.  Follow these instructions at home:  Rest your ankle.  Take over-the-counter and prescription medicines only as told by your health care provider.  For 2-3 days, keep your ankle raised (elevated) above the level of your heart as much as possible.  If directed, apply ice to the area: ? Put ice in a plastic bag. ? Place a towel between your skin and the bag. ? Leave the ice on for 20 minutes, 2-3 times a day.  If you were given a brace: ? Wear it as directed. ? Remove it to shower or bathe. ? Try not to move your ankle much, but wiggle your toes from time to time. This helps to prevent swelling.  If you were given an elastic bandage (dressing): ? Remove it to shower or bathe. ? Try not to move your ankle much, but wiggle your toes from time to time. This helps to prevent swelling. ? Adjust the dressing to make it more comfortable if it feels too tight. ? Loosen the dressing if you have numbness or tingling in your foot, or if your foot becomes cold and blue.  If you have crutches, use them  as told by your health care provider. Continue to use them until you can walk without feeling pain in your ankle. Contact a health care provider if:  You have rapidly increasing bruising or swelling.  Your pain is not relieved with medicine. Get help right away if:  Your toes or foot becomes numb or blue.  You have severe pain that gets worse. This information is not intended to replace advice given to you by your health care provider. Make sure you discuss any questions you have with your health care provider. Document Released: 06/11/2005 Document Revised: 07/19/2016 Document Reviewed: 01/11/2015 Elsevier Interactive Patient Education  2018 [employer]   Ankle Sprain, Phase I Rehab Ask your health care provider which exercises are safe for you. Do exercises exactly as told by your health care provider and adjust them as directed. It is normal to feel mild stretching, pulling, tightness, or discomfort as you do these exercises, but you should stop right away if you feel sudden pain or your pain gets worse.Do not begin these exercises until told by your health care provider. Stretching and range of motion exercises These exercises warm up your muscles and joints and improve the movement and flexibility of your lower leg and ankle. These exercises also help to relieve pain and stiffness. Exercise A: Gastroc and soleus stretch  1. Sit on the floor with your left / right leg extended. 2. Loop a belt or towel around the ball of your left / right foot. The ball of your foot is on the walking surface, right under your toes. 3. Keep your left / right ankle and foot relaxed and keep your knee straight while you use the belt or towel to pull your foot toward you. You should feel a gentle stretch behind your calf or knee. 4. Hold this position for __________ seconds, then release to the starting position. Repeat the exercise with your knee bent. You can put a pillow or a rolled bath towel under your knee to support it. You should feel a stretch deep in your calf or at your Achilles tendon. Repeat each stretch __________ times. Complete these stretches __________ times a day. Exercise B: Ankle alphabet  1. Sit with your left / right leg supported at the lower leg. ? Do not rest your foot on anything. ? Make sure your foot has room to move freely. 2. Think of your left / right foot as a paintbrush, and move your foot to trace each letter of the alphabet in the air. Keep your hip and knee still while you trace. Make the letters as large as you can without feeling discomfort. 3. Trace every letter from A to Z. Repeat __________ times. Complete this  exercise __________ times a day. Strengthening exercises These exercises build strength and endurance in your ankle and lower leg. Endurance is the ability to use your muscles for a long time, even after they get tired. Exercise C: Dorsiflexors  1. Secure a rubber exercise band or tube to an object, such as a table leg, that will stay still when the band is pulled. Secure the other end around your left / right foot. 2. Sit on the floor facing the object, with your left / right leg extended. The band or tube should be slightly tense when your foot is relaxed. 3. Slowly bring your foot toward you, pulling the band tighter. 4. Hold this position for __________ seconds. 5. Slowly return your foot to the starting position. Repeat __________ times.  Complete this exercise __________ times a day. Exercise D: Plantar flexors  1. Sit on the floor with your left / right leg extended. 2. Loop a rubber exercise tube or band around the ball of your left / right foot. The ball of your foot is on the walking surface, right under your toes. ? Hold the ends of the band or tube in your hands. ? The band or tube should be slightly tense when your foot is relaxed. 3. Slowly point your foot and toes downward, pushing them away from you. 4. Hold this position for __________ seconds. 5. Slowly return your foot to the starting position. Repeat __________ times. Complete this exercise __________ times a day. Exercise E: Evertors 1. Sit on the floor with your legs straight out in front of you. 2. Loop a rubber exercise band or tube around the ball of your left / right foot. The ball of your foot is on the walking surface, right under your toes. ? Hold the ends of the band in your hands, or secure the band to a stable object. ? The band or tube should be slightly tense when your foot is relaxed. 3. Slowly push your foot outward, away from your other leg. 4. Hold this position for __________ seconds. 5. Slowly return  your foot to the starting position. Repeat __________ times. Complete this exercise __________ times a day. This information is not intended to replace advice given to you by your health care provider. Make sure you discuss any questions you have with your health care provider. Document Released: 01/10/2005 Document Revised: 02/16/2016 Document Reviewed: 04/25/2015 Elsevier Interactive Patient Education  2018 [employer].   Abrasion An abrasion is a cut or scrape on the outer surface of your skin. An abrasion does not extend through all of the layers of your skin. It is important to care for your abrasion properly to prevent infection. What are the causes? Most abrasions are caused by falling on or gliding across the ground or another surface. When your skin rubs on something, the outer and inner layer of skin rubs off. What are the signs or symptoms? A cut or scrape is the main symptom of this condition. The scrape may be bleeding, or it may appear red or pink. If there was an associated fall, there may be an underlying bruise. How is this diagnosed? An abrasion is diagnosed with a physical exam. How is this treated? Treatment for this condition depends on how large and deep the abrasion is. Usually, your abrasion will be cleaned with water and mild soap. This removes any dirt or debris that may be stuck. An antibiotic ointment may be applied to the abrasion to help prevent infection. A bandage (dressing) may be placed on the abrasion to keep it clean. You may also need a tetanus shot. Follow these instructions at home: Medicines  Take or apply medicines only as directed by your health care provider.  If you were prescribed an antibiotic ointment, finish all of it even if you start to feel better. Wound care  Clean the wound with mild soap and water 2-3 times per day or as directed by your health care provider. Pat your wound dry with a clean towel. Do not rub it.  There are many  different ways to close and cover a wound. Follow instructions from your health care provider about: ? Wound care. ? Dressing changes and removal.  Check your wound every day for signs of infection. Watch for: ? Redness,  swelling, or pain. ? Fluid, blood, or pus. General instructions   Keep the dressing dry as directed by your health care provider. Do not take baths, swim, use a hot tub, or do anything that would put your wound underwater until your health care provider approves.  If there is swelling, raise (elevate) the injured area above the level of your heart while you are sitting or lying down.  Keep all follow-up visits as directed by your health care provider. This is important. Contact a health care provider if:  You received a tetanus shot and you have swelling, severe pain, redness, or bleeding at the injection site.  Your pain is not controlled with medicine.  You have increased redness, swelling, or pain at the site of your wound. Get help right away if:  You have a red streak going away from your wound.  You have a fever.  You have fluid, blood, or pus coming from your wound.  You notice a bad smell coming from your wound or your dressing. This information is not intended to replace advice given to you by your health care provider. Make sure you discuss any questions you have with your health care provider. Document Released: 03/21/2005 Document Revised: 02/10/2016 Document Reviewed: 06/09/2014 Elsevier Interactive Patient Education  2017 [employer]   Contusion A contusion is a deep bruise. Contusions are the result of a blunt injury to tissues and muscle fibers under the skin. The injury causes bleeding under the skin. The skin overlying the contusion may turn blue, purple, or yellow. Minor injuries will give you a painless contusion, but more severe contusions may stay painful and swollen for a few weeks. What are the causes? This condition is usually caused  by a blow, trauma, or direct force to an area of the body. What are the signs or symptoms? Symptoms of this condition include:  Swelling of the injured area.  Pain and tenderness in the injured area.  Discoloration. The area may have redness and then turn blue, purple, or yellow.  How is this diagnosed? This condition is diagnosed based on a physical exam and medical history. An X-ray, CT scan, or MRI may be needed to determine if there are any associated injuries, such as broken bones (fractures). How is this treated? Specific treatment for this condition depends on what area of the body was injured. In general, the best treatment for a contusion is resting, icing, applying pressure to (compression), and elevating the injured area. This is often called the RICE strategy. Over-the-counter anti-inflammatory medicines may also be recommended for pain control. Follow these instructions at home:  Rest the injured area.  If directed, apply ice to the injured area: ? Put ice in a plastic bag. ? Place a towel between your skin and the bag. ? Leave the ice on for 20 minutes, 2-3 times per day.  If directed, apply light compression to the injured area using an elastic bandage. Make sure the bandage is not wrapped too tightly. Remove and reapply the bandage as directed by your health care provider.  If possible, raise (elevate) the injured area above the level of your heart while you are sitting or lying down.  Take over-the-counter and prescription medicines only as told by your health care provider. Contact a health care provider if:  Your symptoms do not improve after several days of treatment.  Your symptoms get worse.  You have difficulty moving the injured area. Get help right away if:  You have severe  pain.  You have numbness in a hand or foot.  Your hand or foot turns pale or cold. This information is not intended to replace advice given to you by your health care provider.  Make sure you discuss any questions you have with your health care provider. Document Released: 03/21/2005 Document Revised: 10/20/2015 Document Reviewed: 10/27/2014 Elsevier Interactive Patient Education  2018 [employer].    IF you received an x-ray today, you will receive an invoice from [facility]. Please contact [facility] at [phone] with questions or concerns regarding your invoice.   IF you received labwork today, you will receive an invoice from [facility]. Please contact [facility] at [phone] with questions or concerns regarding your invoice.   Our billing staff will not be able to assist you with questions regarding bills from these companies.  You will be contacted with the lab results as soon as they are available. The fastest way to get your results is to activate your My Chart account. Instructions are located on the last page of this paperwork. If you have not heard from [place] regarding the results in 2 weeks, please contact this office.

## 2017-10-21 NOTE — Progress Notes (Signed)
Subjective:  By signing my name below, I, Essence Howell, attest that this documentation has been prepared under the direction and in the presence of Shade Flood, MD Electronically Signed: Charline Bills, ED Scribe 10/21/2017 at 12:05 PM.   Patient ID: Tanya Ingram, female    DOB: 10-Apr-1976, 42 y.o.   MRN: [id number]  Chief Complaint  Patient presents with  . Fall    satuday night 11pm. Went into a bit of a shock. Rolled left ankle and hurt right knee   HPI Tanya Ingram is a 42 y.o. female who presents to Primary Care at [facility] complaining of a fall that occurred 2 nights ago. Pt states that she went into "shock" and was unable to speak for a few seconds after the fall. Describes shock as an outer body experience and reports feeling "shaky". Pt was conscious. Denies head injury, biting her tongue, bladder/bowel incontinence, seizures.  L Ankle  Pt reports rolling her L ankle between grass and pavement upon falling 2 nights ago. States she heard a pop in her L ankle when the incident happened and she was unable to bear weight for ~5 mins following the accident. States she is now able to bear weight as she only has pain with bending, turning or palpation. Pt has sprained the same ankle twice in the past with the last being in 2013. 01/2012 L ankle XR showed old avulsion fracture.  R Knee Pt also reports R knee abrasion sustained from the fall. Reports R knee pain with bearing weight, bending and palpation. She was wearing jeans, which now has a hole in the R knee. She has applied soap, water, peroxide, antibiotic cream to the wound.  Patient Active Problem List   Diagnosis Date Noted  . Adult situational stress disorder 04/15/2017  . Smoker 03/15/2017  . BMI 29.0-29.9,adult 03/15/2017  . Abdominal pain 04/30/2016  . IUD (intrauterine device) in place 04/08/2015  . History of gestational diabetes mellitus 03/11/2015   Past Medical History:  Diagnosis Date  . Chronic tension  headaches   . Depression    Past Surgical History:  Procedure Laterality Date  . DILATION AND CURETTAGE OF UTERUS    . EYE SURGERY  2000   Lasix   Allergies  Allergen Reactions  . Nickel Rash  . Percocet [Oxycodone-Acetaminophen] Rash   Prior to Admission medications   Medication Sig Start Date End Date Taking? Authorizing Provider  Aspirin-Acetaminophen-Caffeine (EXCEDRIN PO) Take 1 capsule by mouth as needed. 3-4 times weekly   Yes [provider]   Social History   Socioeconomic History  . Marital status: Married    Spouse name: Not on file  . Number of children: 2  . Years of education: Not on file  . Highest education level: Not on file  Occupational History  . Occupation: Nutritional therapist: [employer]  Social Needs  . Financial resource strain: Not on file  . Food insecurity:    Worry: Not on file    Inability: Not on file  . Transportation needs:    Medical: Not on file    Non-medical: Not on file  Tobacco Use  . Smoking status: Former Smoker    Packs/day: 0.25    Years: 20.00    Pack years: 5.00    Types: Cigarettes  . Smokeless tobacco: Never Used  . Tobacco comment: Switch between vaping and cigarettes   Substance and Sexual Activity  . Alcohol use: Yes    Alcohol/week:  0.6 oz    Types: 1 Standard drinks or equivalent per week    Comment: socially  . Drug use: No  . Sexual activity: Yes    Birth control/protection: IUD    Comment: INTERCOURSE AGE 74, SEXUAL PARTNERS MORE THAN 5  Lifestyle  . Physical activity:    Days per week: Not on file    Minutes per session: Not on file  . Stress: Not on file  Relationships  . Social connections:    Talks on phone: Not on file    Gets together: Not on file    Attends religious service: Not on file    Active member of club or organization: Not on file    Attends meetings of clubs or organizations: Not on file    Relationship status: Not on file  . Intimate partner violence:    Fear of  current or ex partner: Not on file    Emotionally abused: Not on file    Physically abused: Not on file    Forced sexual activity: Not on file  Other Topics Concern  . Not on file  Social History Narrative   Lives here in [place] with her husband and two sons.   Review of Systems  Musculoskeletal: Positive for arthralgias.  Skin: Positive for wound.  Neurological: Negative for seizures.      Objective:   Physical Exam  Musculoskeletal:  L knee including L proximal fibula: nontender. Tender along distal fibula with associated soft tissue swelling. Ecchymosis below lateral malleolus. Tender along anterior lateral ankle near ATF. 5th metatarsal nontender. Midfoot is nontender. Somewhat guarded but able to flex and extends. Pain with inversion, eversion, minimal moment. Laxity with anterior drawer. Guarded with minimal taller tilt. Exam was stopped. R knee: ~1-1.5 cm abrasion across anterior knee overlying patellar tendon. Tenderness along distal patellar pole. Extensor mechanism intact. No lag. Full extension. Flexion to 80 degrees without difficulty. Joint lines are nontender. I do not appreciate an effusion.   Vitals:   10/21/17 1148  BP: 94/60  Pulse: 82  Temp: 98 F (36.7 C)  TempSrc: Oral  SpO2: 100%  Weight: 204 lb (92.5 kg)  Height:  (1.727 m)      Assessment & Plan:   Tanya Ingram is a 42 y.o. female Acute pain of right knee - Plan: DG Knee Complete 4 Views Right  Acute left ankle pain - Plan: DG Ankle Complete Left, Apply ASO ankle  Sprain of left ankle, unspecified ligament, initial encounter - Plan: Apply ASO ankle  Contusion of right knee, initial encounter  Abrasion of right knee, initial encounter  Left lateral ankle sprain without apparent acute fracture as well as right knee contusion, abrasion.  X-rays reassuring of both.  Able to weight-bear.    -ASO brace provided for left ankle, handout given on ankle sprain as well as phase 1  rehabilitation.  Stressed importance of range of motion initially, symptomatic care with Tylenol, ice, compression elevation initially.  -Symptomatic care discussed for abrasion over left knee without signs of infection at this time.  Suspect a contusion of same area.  Handout also given with this condition.  -Recheck 2 weeks if not significantly improving, sooner if worse  -Possible vagal episode after initial pain from the event.  Denies any true seizure activity, but advised to discuss the symptoms further with her primary care provider, especially if they recur.  No orders of the defined types were placed in this encounter.  Patient Instructions  Ankle x-ray does not indicate any fracture.  It appears to be a lateral ankle sprain as we discussed.  Okay to wear the brace as needed for now, with range of motion out of the brace twice per day initially.  See handout below on ankle sprain and initial rehab as your symptoms improve.  Tylenol as needed, other symptomatic care as discussed below.  Recheck ankle in the next 2 weeks, sooner if needed.  Right knee pain appears to be due to a contusion.  See information below night condition.  For the abrasion I also included information below, but keep the area clean with soap and water cleansing twice per day, Polysporin or triple antibiotic ointment if needed.  Watch for signs of infection as we discussed. Return to the clinic or go to the nearest emergency room if any of your symptoms worsen or new symptoms occur.  Thank you for coming in today.   Ankle Sprain An ankle sprain is a stretch or tear in one of the tough, fiber-like tissues (ligaments) in the ankle. The ligaments in your ankle help to hold the bones of the ankle together. What are the causes? This condition is often caused by stepping on or falling on the outer edge of the foot. What increases the risk? This condition is more likely to develop in people who play sports. What are the  signs or symptoms? Symptoms of this condition include:  Pain in your ankle.  Swelling.  Bruising. Bruising may develop right after you sprain your ankle or 1-2 days later.  Trouble standing or walking, especially when you turn or change directions.  How is this diagnosed? This condition is diagnosed with a physical exam. During the exam, your health care provider will press on certain parts of your foot and ankle and try to move them in certain ways. X-rays may be taken to see how severe the sprain is and to check for broken bones. How is this treated? This condition may be treated with:  A brace. This is used to keep the ankle from moving until it heals.  An elastic bandage. This is used to support the ankle.  Crutches.  Pain medicine.  Surgery. This may be needed if the sprain is severe.  Physical therapy. This may help to improve the range of motion in the ankle.  Follow these instructions at home:  Rest your ankle.  Take over-the-counter and prescription medicines only as told by your health care provider.  For 2-3 days, keep your ankle raised (elevated) above the level of your heart as much as possible.  If directed, apply ice to the area: ? Put ice in a plastic bag. ? Place a towel between your skin and the bag. ? Leave the ice on for 20 minutes, 2-3 times a day.  If you were given a brace: ? Wear it as directed. ? Remove it to shower or bathe. ? Try not to move your ankle much, but wiggle your toes from time to time. This helps to prevent swelling.  If you were given an elastic bandage (dressing): ? Remove it to shower or bathe. ? Try not to move your ankle much, but wiggle your toes from time to time. This helps to prevent swelling. ? Adjust the dressing to make it more comfortable if it feels too tight. ? Loosen the dressing if you have numbness or tingling in your foot, or if your foot becomes cold and blue.  If you have crutches, use them  as told by your  health care provider. Continue to use them until you can walk without feeling pain in your ankle. Contact a health care provider if:  You have rapidly increasing bruising or swelling.  Your pain is not relieved with medicine. Get help right away if:  Your toes or foot becomes numb or blue.  You have severe pain that gets worse. This information is not intended to replace advice given to you by your health care provider. Make sure you discuss any questions you have with your health care provider. Document Released: 06/11/2005 Document Revised: 07/19/2016 Document Reviewed: 01/11/2015 Elsevier Interactive Patient Education  2018 Else[employer] Inc.   Ankle Sprain, Phase I Rehab Ask your health care provider which exercises are safe for you. Do exercises exactly as told by your health care provider and adjust them as directed. It is normal to feel mild stretching, pulling, tightness, or discomfort as you do these exercises, but you should stop right away if you feel sudden pain or your pain gets worse.Do not begin these exercises until told by your health care provider. Stretching and range of motion exercises These exercises warm up your muscles and joints and improve the movement and flexibility of your lower leg and ankle. These exercises also help to relieve pain and stiffness. Exercise A: Gastroc and soleus stretch  1. Sit on the floor with your left / right leg extended. 2. Loop a belt or towel around the ball of your left / right foot. The ball of your foot is on the walking surface, right under your toes. 3. Keep your left / right ankle and foot relaxed and keep your knee straight while you use the belt or towel to pull your foot toward you. You should feel a gentle stretch behind your calf or knee. 4. Hold this position for __________ seconds, then release to the starting position. Repeat the exercise with your knee bent. You can put a pillow or a rolled bath towel under your knee to  support it. You should feel a stretch deep in your calf or at your Achilles tendon. Repeat each stretch __________ times. Complete these stretches __________ times a day. Exercise B: Ankle alphabet  1. Sit with your left / right leg supported at the lower leg. ? Do not rest your foot on anything. ? Make sure your foot has room to move freely. 2. Think of your left / right foot as a paintbrush, and move your foot to trace each letter of the alphabet in the air. Keep your hip and knee still while you trace. Make the letters as large as you can without feeling discomfort. 3. Trace every letter from A to Z. Repeat __________ times. Complete this exercise __________ times a day. Strengthening exercises These exercises build strength and endurance in your ankle and lower leg. Endurance is the ability to use your muscles for a long time, even after they get tired. Exercise C: Dorsiflexors  1. Secure a rubber exercise band or tube to an object, such as a table leg, that will stay still when the band is pulled. Secure the other end around your left / right foot. 2. Sit on the floor facing the object, with your left / right leg extended. The band or tube should be slightly tense when your foot is relaxed. 3. Slowly bring your foot toward you, pulling the band tighter. 4. Hold this position for __________ seconds. 5. Slowly return your foot to the starting position. Repeat __________ times.  Complete this exercise __________ times a day. Exercise D: Plantar flexors  1. Sit on the floor with your left / right leg extended. 2. Loop a rubber exercise tube or band around the ball of your left / right foot. The ball of your foot is on the walking surface, right under your toes. ? Hold the ends of the band or tube in your hands. ? The band or tube should be slightly tense when your foot is relaxed. 3. Slowly point your foot and toes downward, pushing them away from you. 4. Hold this position for __________  seconds. 5. Slowly return your foot to the starting position. Repeat __________ times. Complete this exercise __________ times a day. Exercise E: Evertors 1. Sit on the floor with your legs straight out in front of you. 2. Loop a rubber exercise band or tube around the ball of your left / right foot. The ball of your foot is on the walking surface, right under your toes. ? Hold the ends of the band in your hands, or secure the band to a stable object. ? The band or tube should be slightly tense when your foot is relaxed. 3. Slowly push your foot outward, away from your other leg. 4. Hold this position for __________ seconds. 5. Slowly return your foot to the starting position. Repeat __________ times. Complete this exercise __________ times a day. This information is not intended to replace advice given to you by your health care provider. Make sure you discuss any questions you have with your health care provider. Document Released: 01/10/2005 Document Revised: 02/16/2016 Document Reviewed: 04/25/2015 Elsevier Interactive Patient Education  2018 [employer].   Abrasion An abrasion is a cut or scrape on the outer surface of your skin. An abrasion does not extend through all of the layers of your skin. It is important to care for your abrasion properly to prevent infection. What are the causes? Most abrasions are caused by falling on or gliding across the ground or another surface. When your skin rubs on something, the outer and inner layer of skin rubs off. What are the signs or symptoms? A cut or scrape is the main symptom of this condition. The scrape may be bleeding, or it may appear red or pink. If there was an associated fall, there may be an underlying bruise. How is this diagnosed? An abrasion is diagnosed with a physical exam. How is this treated? Treatment for this condition depends on how large and deep the abrasion is. Usually, your abrasion will be cleaned with water and mild  soap. This removes any dirt or debris that may be stuck. An antibiotic ointment may be applied to the abrasion to help prevent infection. A bandage (dressing) may be placed on the abrasion to keep it clean. You may also need a tetanus shot. Follow these instructions at home: Medicines  Take or apply medicines only as directed by your health care provider.  If you were prescribed an antibiotic ointment, finish all of it even if you start to feel better. Wound care  Clean the wound with mild soap and water 2-3 times per day or as directed by your health care provider. Pat your wound dry with a clean towel. Do not rub it.  There are many different ways to close and cover a wound. Follow instructions from your health care provider about: ? Wound care. ? Dressing changes and removal.  Check your wound every day for signs of infection. Watch for: ? Redness,  swelling, or pain. ? Fluid, blood, or pus. General instructions   Keep the dressing dry as directed by your health care provider. Do not take baths, swim, use a hot tub, or do anything that would put your wound underwater until your health care provider approves.  If there is swelling, raise (elevate) the injured area above the level of your heart while you are sitting or lying down.  Keep all follow-up visits as directed by your health care provider. This is important. Contact a health care provider if:  You received a tetanus shot and you have swelling, severe pain, redness, or bleeding at the injection site.  Your pain is not controlled with medicine.  You have increased redness, swelling, or pain at the site of your wound. Get help right away if:  You have a red streak going away from your wound.  You have a fever.  You have fluid, blood, or pus coming from your wound.  You notice a bad smell coming from your wound or your dressing. This information is not intended to replace advice given to you by your health care  provider. Make sure you discuss any questions you have with your health care provider. Document Released: 03/21/2005 Document Revised: 02/10/2016 Document Reviewed: 06/09/2014 Elsevier Interactive Patient Education  2017 [employer]   Contusion A contusion is a deep bruise. Contusions are the result of a blunt injury to tissues and muscle fibers under the skin. The injury causes bleeding under the skin. The skin overlying the contusion may turn blue, purple, or yellow. Minor injuries will give you a painless contusion, but more severe contusions may stay painful and swollen for a few weeks. What are the causes? This condition is usually caused by a blow, trauma, or direct force to an area of the body. What are the signs or symptoms? Symptoms of this condition include:  Swelling of the injured area.  Pain and tenderness in the injured area.  Discoloration. The area may have redness and then turn blue, purple, or yellow.  How is this diagnosed? This condition is diagnosed based on a physical exam and medical history. An X-ray, CT scan, or MRI may be needed to determine if there are any associated injuries, such as broken bones (fractures). How is this treated? Specific treatment for this condition depends on what area of the body was injured. In general, the best treatment for a contusion is resting, icing, applying pressure to (compression), and elevating the injured area. This is often called the RICE strategy. Over-the-counter anti-inflammatory medicines may also be recommended for pain control. Follow these instructions at home:  Rest the injured area.  If directed, apply ice to the injured area: ? Put ice in a plastic bag. ? Place a towel between your skin and the bag. ? Leave the ice on for 20 minutes, 2-3 times per day.  If directed, apply light compression to the injured area using an elastic bandage. Make sure the bandage is not wrapped too tightly. Remove and reapply the  bandage as directed by your health care provider.  If possible, raise (elevate) the injured area above the level of your heart while you are sitting or lying down.  Take over-the-counter and prescription medicines only as told by your health care provider. Contact a health care provider if:  Your symptoms do not improve after several days of treatment.  Your symptoms get worse.  You have difficulty moving the injured area. Get help right away if:  You have  severe pain.  You have numbness in a hand or foot.  Your hand or foot turns pale or cold. This information is not intended to replace advice given to you by your health care provider. Make sure you discuss any questions you have with your health care provider. Document Released: 03/21/2005 Document Revised: 10/20/2015 Document Reviewed: 10/27/2014 Elsevier Interactive Patient Education  2018 [employer].    IF you received an x-ray today, you will receive an invoice from [facility]. Please contact [facility] at [phone] with questions or concerns regarding your invoice.   IF you received labwork today, you will receive an invoice from [facility]. Please contact [facility] at [phone] with questions or concerns regarding your invoice.   Our billing staff will not be able to assist you with questions regarding bills from these companies.  You will be contacted with the lab results as soon as they are available. The fastest way to get your results is to activate your My Chart account. Instructions are located on the last page of this paperwork. If you have not heard from [place] regarding the results in 2 weeks, please contact this office.       I personally performed the services described in this documentation, which was scribed in my presence. The recorded information has been reviewed and considered for accuracy and completeness, addended by me as needed, and agree with information above.  Signed,    Meredith Staggers, MD Primary Care at [facility].  10/21/17 1:59 PM

## 2017-10-22 ENCOUNTER — Encounter: Payer: Self-pay | Admitting: Physician Assistant

## 2017-12-07 ENCOUNTER — Encounter: Payer: Self-pay | Admitting: Urgent Care

## 2017-12-07 ENCOUNTER — Ambulatory Visit: Payer: BLUE CROSS/BLUE SHIELD | Admitting: Urgent Care

## 2017-12-07 ENCOUNTER — Other Ambulatory Visit: Payer: Self-pay

## 2017-12-07 VITALS — BP 100/68 | HR 75 | Temp 98.1°F | Resp 16 | Ht 67.75 in | Wt 210.6 lb

## 2017-12-07 DIAGNOSIS — N3090 Cystitis, unspecified without hematuria: Secondary | ICD-10-CM | POA: Diagnosis not present

## 2017-12-07 DIAGNOSIS — R300 Dysuria: Secondary | ICD-10-CM | POA: Diagnosis not present

## 2017-12-07 DIAGNOSIS — R350 Frequency of micturition: Secondary | ICD-10-CM

## 2017-12-07 LAB — POC MICROSCOPIC URINALYSIS (UMFC): Mucus: ABSENT

## 2017-12-07 LAB — POCT URINALYSIS DIP (MANUAL ENTRY)
Bilirubin, UA: NEGATIVE
Glucose, UA: NEGATIVE mg/dL
Ketones, POC UA: NEGATIVE mg/dL
Leukocytes, UA: NEGATIVE
Nitrite, UA: NEGATIVE
Protein Ur, POC: NEGATIVE mg/dL
Spec Grav, UA: 1.025 (ref 1.010–1.025)
Urobilinogen, UA: 0.2 E.U./dL
pH, UA: 6 (ref 5.0–8.0)

## 2017-12-07 LAB — POCT URINE PREGNANCY: Preg Test, Ur: NEGATIVE

## 2017-12-07 MED ORDER — NITROFURANTOIN MONOHYD MACRO 100 MG PO CAPS: 100.0000 mg | ORAL_CAPSULE | Freq: Two times a day (BID) | ORAL | 0 refills | Status: DC

## 2017-12-07 NOTE — Progress Notes (Signed)
    MRN: [id number] DOB: 06/18/1976  Subjective:   Tanya Ingram is a 42 y.o. female presenting for 1.5 week history of dysuria, urinary frequency and urinary urgency. Has not tried any medications for relief. Denies fever, hematuria, flank pain, abdominal pain, pelvic pain, cloudy malordorous urine, genital rash and vaginal discharge, nausea and vomiting. Patient is a former smoker.  Tanya Ingram has a current medication list which includes the following prescription(s): aspirin-acetaminophen-caffeine. Patient is allergic to nickel and percocet [oxycodone-acetaminophen]. Tanya Ingram  has a past medical history of Chronic tension headaches and Depression. Also  has a past surgical history that includes Dilation and curettage of uterus and Eye surgery (2000).  Objective:   Vitals: BP 100/68 (BP Location: Left Arm, Patient Position: Sitting, Cuff Size: Normal)   Pulse 75   Temp 98.1 F (36.7 C) (Oral)   Resp 16   Ht 5' 7.75" (1.721 m)   Wt 210 lb 9.6 oz (95.5 kg)   SpO2 98%   BMI 32.26 kg/m   Physical Exam  Constitutional: She is oriented to person, place, and time. She appears well-developed and well-nourished.  Cardiovascular: Normal rate.  Pulmonary/Chest: Effort normal.  Neurological: She is alert and oriented to person, place, and time.   Results for orders placed or performed in visit on 12/07/17 (from the past 24 hour(s))  POCT urinalysis dipstick     Status: Abnormal   Collection Time: 12/07/17 12:16 PM  Result Value Ref Range   Color, UA yellow yellow   Clarity, UA clear clear   Glucose, UA negative negative mg/dL   Bilirubin, UA negative negative   Ketones, POC UA negative negative mg/dL   Spec Grav, UA [id number] 1.1911.010 - 1.025   Blood, UA trace-lysed (A) negative   pH, UA 6.0 5.0 - 8.0   Protein Ur, POC negative negative mg/dL   Urobilinogen, UA 0.2 0.2 or 1.0 E.U./dL   Nitrite, UA Negative Negative   Leukocytes, UA Negative Negative  POCT Microscopic Urinalysis ([facility])      Status: Abnormal   Collection Time: 12/07/17 12:22 PM  Result Value Ref Range   WBC,UR,HPF,POC Moderate (A) None WBC/hpf   RBC,UR,HPF,POC None None RBC/hpf   Bacteria Few (A) None, Too numerous to count   Mucus Absent Absent   Epithelial Cells, UR Per Microscopy None None, Too numerous to count cells/hpf  POCT urine pregnancy     Status: None   Collection Time: 12/07/17 12:27 PM  Result Value Ref Range   Preg Test, Ur Negative Negative    Assessment and Plan :   Dysuria - Plan: POCT urine pregnancy  Urinary frequency - Plan: POCT Microscopic Urinalysis ([facility]), POCT urinalysis dipstick, Urine Culture, POCT urine pregnancy  Cystitis  Will cover for cystitis with Macrobid, urine culture pending.  Recommended patient hydrate aggressively.  She refused STI testing today, reports that she has been in a monogamous relationship with her husband for the past 15 years.  We will follow-up with urine culture results.  Return to clinic precautions discussed.  Wallis BambergMario Dontarius Sheley, PA-C [facility] [phone] 12/07/2017 12:20 PM

## 2017-12-07 NOTE — Patient Instructions (Addendum)
Hydrate well with at least 2 liters (1 gallon) of water daily.    Urinary Tract Infection, Adult A urinary tract infection (UTI) is an infection of any part of the urinary tract, which includes the kidneys, ureters, bladder, and urethra. These organs make, store, and get rid of urine in the body. UTI can be a bladder infection (cystitis) or kidney infection (pyelonephritis). What are the causes? This infection may be caused by fungi, viruses, or bacteria. Bacteria are the most common cause of UTIs. This condition can also be caused by repeated incomplete emptying of the bladder during urination. What increases the risk? This condition is more likely to develop if:  You ignore your need to urinate or hold urine for long periods of time.  You do not empty your bladder completely during urination.  You wipe back to front after urinating or having a bowel movement, if you are female.  You are uncircumcised, if you are female.  You are constipated.  You have a urinary catheter that stays in place (indwelling).  You have a weak defense (immune) system.  You have a medical condition that affects your bowels, kidneys, or bladder.  You have diabetes.  You take antibiotic medicines frequently or for long periods of time, and the antibiotics no longer work well against certain types of infections (antibiotic resistance).  You take medicines that irritate your urinary tract.  You are exposed to chemicals that irritate your urinary tract.  You are female.  What are the signs or symptoms? Symptoms of this condition include:  Fever.  Frequent urination or passing small amounts of urine frequently.  Needing to urinate urgently.  Pain or burning with urination.  Urine that smells bad or unusual.  Cloudy urine.  Pain in the lower abdomen or back.  Trouble urinating.  Blood in the urine.  Vomiting or being less hungry than normal.  Diarrhea or abdominal pain.  Vaginal  discharge, if you are female.  How is this diagnosed? This condition is diagnosed with a medical history and physical exam. You will also need to provide a urine sample to test your urine. Other tests may be done, including:  Blood tests.  Sexually transmitted disease (STD) testing.  If you have had more than one UTI, a cystoscopy or imaging studies may be done to determine the cause of the infections. How is this treated? Treatment for this condition often includes a combination of two or more of the following:  Antibiotic medicine.  Other medicines to treat less common causes of UTI.  Over-the-counter medicines to treat pain.  Drinking enough water to stay hydrated.  Follow these instructions at home:  Take over-the-counter and prescription medicines only as told by your health care provider.  If you were prescribed an antibiotic, take it as told by your health care provider. Do not stop taking the antibiotic even if you start to feel better.  Avoid alcohol, caffeine, tea, and carbonated beverages. They can irritate your bladder.  Drink enough fluid to keep your urine clear or pale yellow.  Keep all follow-up visits as told by your health care provider. This is important.  Make sure to: ? Empty your bladder often and completely. Do not hold urine for long periods of time. ? Empty your bladder before and after sex. ? Wipe from front to back after a bowel movement if you are female. Use each tissue one time when you wipe. Contact a health care provider if:  You have back pain.    You have a fever.  You feel nauseous or vomit.  Your symptoms do not get better after 3 days.  Your symptoms go away and then return. Get help right away if:  You have severe back pain or lower abdominal pain.  You are vomiting and cannot keep down any medicines or water. This information is not intended to replace advice given to you by your health care provider. Make sure you discuss any  questions you have with your health care provider. Document Released: 03/21/2005 Document Revised: 11/23/2015 Document Reviewed: 05/02/2015 Elsevier Interactive Patient Education  2018 [employer]     IF you received an x-ray today, you will receive an invoice from [facility]. Please contact [facility] at [phone] with questions or concerns regarding your invoice.   IF you received labwork today, you will receive an invoice from [facility]. Please contact [facility] at [phone] with questions or concerns regarding your invoice.   Our billing staff will not be able to assist you with questions regarding bills from these companies.  You will be contacted with the lab results as soon as they are available. The fastest way to get your results is to activate your My Chart account. Instructions are located on the last page of this paperwork. If you have not heard from us regarding the results in 2 weeks, please contact this office.     

## 2017-12-11 LAB — URINE CULTURE

## 2020-02-12 ENCOUNTER — Other Ambulatory Visit: Payer: Self-pay | Admitting: Obstetrics and Gynecology

## 2020-02-12 DIAGNOSIS — R928 Other abnormal and inconclusive findings on diagnostic imaging of breast: Secondary | ICD-10-CM

## 2020-02-25 ENCOUNTER — Ambulatory Visit
Admission: RE | Admit: 2020-02-25 | Discharge: 2020-02-25 | Disposition: A | Discharge status: Home / Self Care | Payer: BLUE CROSS/BLUE SHIELD | Source: Ambulatory Visit | Attending: Obstetrics and Gynecology | Admitting: Obstetrics and Gynecology

## 2020-02-25 ENCOUNTER — Other Ambulatory Visit: Payer: Self-pay

## 2020-02-25 DIAGNOSIS — R928 Other abnormal and inconclusive findings on diagnostic imaging of breast: Secondary | ICD-10-CM

## 2020-03-29 ENCOUNTER — Emergency Department (HOSPITAL_COMMUNITY)
Admission: EM | Admit: 2020-03-29 | Discharge: 2020-03-29 | Disposition: A | Discharge status: Home / Self Care | Payer: BC Managed Care – PPO | Attending: Emergency Medicine | Admitting: Emergency Medicine

## 2020-03-29 ENCOUNTER — Other Ambulatory Visit: Payer: Self-pay

## 2020-03-29 ENCOUNTER — Encounter (HOSPITAL_COMMUNITY): Payer: Self-pay

## 2020-03-29 DIAGNOSIS — K644 Residual hemorrhoidal skin tags: Secondary | ICD-10-CM | POA: Diagnosis not present

## 2020-03-29 DIAGNOSIS — F17210 Nicotine dependence, cigarettes, uncomplicated: Secondary | ICD-10-CM | POA: Diagnosis not present

## 2020-03-29 DIAGNOSIS — Z7982 Long term (current) use of aspirin: Secondary | ICD-10-CM | POA: Diagnosis not present

## 2020-03-29 MED ORDER — PRAMOXINE HCL (PERIANAL) 1 % EX FOAM: 1.0000 "application " | Freq: Three times a day (TID) | CUTANEOUS | 0 refills | Status: DC | PRN

## 2020-03-29 MED ORDER — LIDOCAINE 5 % EX OINT: 1.0000 "application " | TOPICAL_OINTMENT | Freq: Four times a day (QID) | CUTANEOUS | 0 refills | Status: DC | PRN

## 2020-03-29 NOTE — ED Notes (Signed)
I called patient to take back to a room and no one responded 

## 2020-03-29 NOTE — ED Notes (Signed)
Pt discharged from this ED in stable condition at this time. All discharge instructions and follow up care reviewed with pt with no further questions at this time. Pt ambulatory with steady gait, clear speech.  

## 2020-03-29 NOTE — Discharge Instructions (Addendum)
Try using aloe gel as we discussed.  Continue to use the sitz baths.  Take the medications as prescribed.  Follow up with the general surgery office if not improving in a week.

## 2020-03-29 NOTE — ED Triage Notes (Signed)
Patient c/o hemorrhoids x 3 days. Patient states she has been using OTC hemorrhoidal medication with no relief. patient states the only relief is when she sits in a bath tub.

## 2020-03-29 NOTE — ED Provider Notes (Signed)
[facility]-EMERGENCY DEPT Provider Note   CSN: [id number] Arrival date & time: 03/29/20  1350     History Chief Complaint  Patient presents with  . Hemorrhoids    Tanya Ingram is a 44 y.o. female.  HPI   Patient presents ED with complaints of hemorrhoid pain. Patient states that he developed 3 days ago. She has tried several over-the-counter medications including creams and has been doing sitz bath's. Patient states she continues to have pain. She is not having any bleeding. No fevers or chills. She came to the ED for further treatment options.  Past Medical History:  Diagnosis Date  . Chronic tension headaches   . Depression     Patient Active Problem List   Diagnosis Date Noted  . Adult situational stress disorder 04/15/2017  . Smoker 03/15/2017  . BMI 29.0-29.9,adult 03/15/2017  . Abdominal pain 04/30/2016  . IUD (intrauterine device) in place 04/08/2015  . History of gestational diabetes mellitus 03/11/2015    Past Surgical History:  Procedure Laterality Date  . DILATION AND CURETTAGE OF UTERUS    . EYE SURGERY  2000   Lasix     OB History    Gravida  2   Para      Term      Preterm      AB  2   Living        SAB      TAB      Ectopic  0   Multiple  2   Live Births              Family History  Problem Relation Age of Onset  . Gallstones Mother   . Stroke Maternal Grandmother   . Stroke Paternal Grandmother   . Diabetes Brother     Social History   Tobacco Use  . Smoking status: Current Every Day Smoker    Packs/day: 1.00    Years: 20.00    Pack years: 20.00    Types: Cigarettes  . Smokeless tobacco: Never Used  . Tobacco comment: Switch between vaping and cigarettes   Vaping Use  . Vaping Use: Never used  Substance Use Topics  . Alcohol use: Yes    Alcohol/week: 1.0 standard drink    Types: 1 Standard drinks or equivalent per week    Comment: socially  . Drug use: Yes    Types: Marijuana     Home Medications Prior to Admission medications   Medication Sig Start Date End Date Taking? Authorizing Provider  Aspirin-Acetaminophen-Caffeine (EXCEDRIN PO) Take 1 capsule by mouth as needed. 3-4 times weekly    [provider]  lidocaine (XYLOCAINE) 5 % ointment Apply 1 application topically 4 (four) times daily as needed. 03/29/20   Linwood Dibbles, MD  nitrofurantoin, macrocrystal-monohydrate, (MACROBID) 100 MG capsule Take 1 capsule (100 mg total) by mouth 2 (two) times daily. 12/07/17   Wallis Bamberg, PA-C  pramoxine (PROCTOFOAM) 1 % foam Place 1 application rectally 3 (three) times daily as needed for hemorrhoids. 03/29/20   Linwood Dibbles, MD    Allergies    Nickel and Percocet [oxycodone-acetaminophen]  Review of Systems   Review of Systems  Constitutional: Negative for fever.  Gastrointestinal: Negative for abdominal pain and blood in stool.  Neurological: Negative for weakness.    Physical Exam Updated Vital Signs BP 108/76   Pulse 87   Temp 98.8 F (37.1 C) (Oral)   Resp 16   Ht 1.727 m (5\' 8" )  Wt 95.3 kg   SpO2 97%   BMI 31.93 kg/m   Physical Exam Vitals and nursing note reviewed.  Constitutional:      General: She is not in acute distress.    Appearance: She is well-developed.  HENT:     Head: Normocephalic and atraumatic.     Right Ear: External ear normal.     Left Ear: External ear normal.  Eyes:     General: No scleral icterus.       Right eye: No discharge.        Left eye: No discharge.     Conjunctiva/sclera: Conjunctivae normal.  Neck:     Trachea: No tracheal deviation.  Cardiovascular:     Rate and Rhythm: Normal rate.  Pulmonary:     Effort: Pulmonary effort is normal. No respiratory distress.     Breath sounds: No stridor.  Abdominal:     General: There is no distension.  Genitourinary:    Comments: External hemorrhoid, tender to palpation, no bleeding, does not appear to be thrombosed Musculoskeletal:        General: No  swelling or deformity.     Cervical back: Neck supple.  Skin:    General: Skin is warm and dry.     Findings: No rash.  Neurological:     Mental Status: She is alert.     Cranial Nerves: Cranial nerve deficit: no gross deficits.     ED Results / Procedures / Treatments   Labs (all labs ordered are listed, but only abnormal results are displayed) Labs Reviewed - No data to display  EKG None  Radiology No results found.  Procedures Procedures (including critical care time)  Medications Ordered in ED Medications - No data to display  ED Course  I have reviewed the triage vital signs and the nursing notes.  Pertinent labs & imaging results that were available during my care of the patient were reviewed by me and considered in my medical decision making (see chart for details).    MDM Rules/Calculators/A&P                          Discussed treatment for her external hemorrhoids.  WIll try topical lidocaine, proctofoam cream.  Discussed use of sitz bath and aloe gel. Final Clinical Impression(s) / ED Diagnoses Final diagnoses:  External hemorrhoids    Rx / DC Orders ED Discharge Orders         Ordered    lidocaine (XYLOCAINE) 5 % ointment  4 times daily PRN        03/29/20 1812    pramoxine (PROCTOFOAM) 1 % foam  3 times daily PRN        03/29/20 1812           Linwood Dibbles, MD 03/29/20 1813

## 2020-10-23 IMAGING — US US BREAST*L* LIMITED INC AXILLA
1 series · 11 of 11 positions shown · non-contrast
Comparison: Previous exam(s).

CLINICAL DATA: Screening recall from baseline for a possible right
breast mass, a left breast possible distortion and 2 possible masses
in the left breast.

EXAM:
DIGITAL DIAGNOSTIC BILATERAL MAMMOGRAM WITH TOMO AND CAD; ULTRASOUND
RIGHT BREAST LIMITED; ULTRASOUND LEFT BREAST LIMITED

[Series 1: us breast*left* limited inc axilla · 0.06mm/px · 11 of 11 slices shown]
[im 1/11]
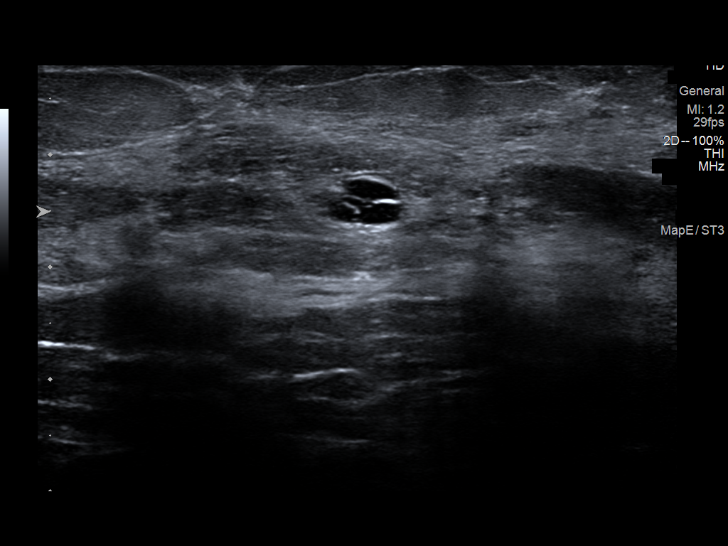
[im 2/11]
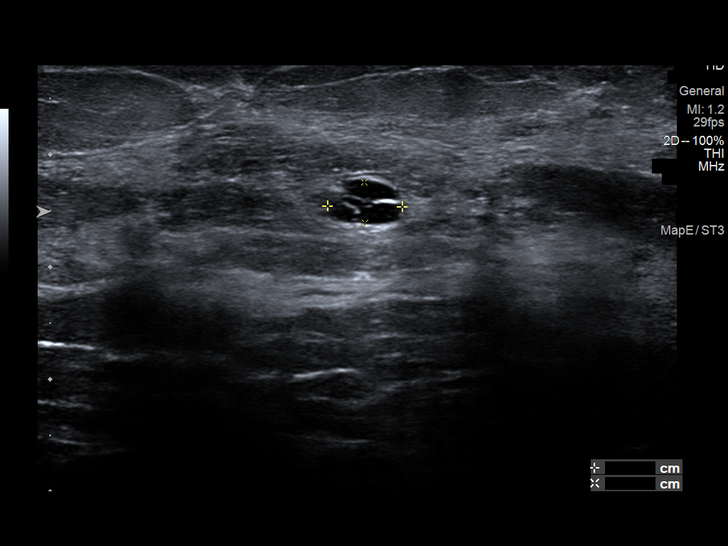
[im 3/11]
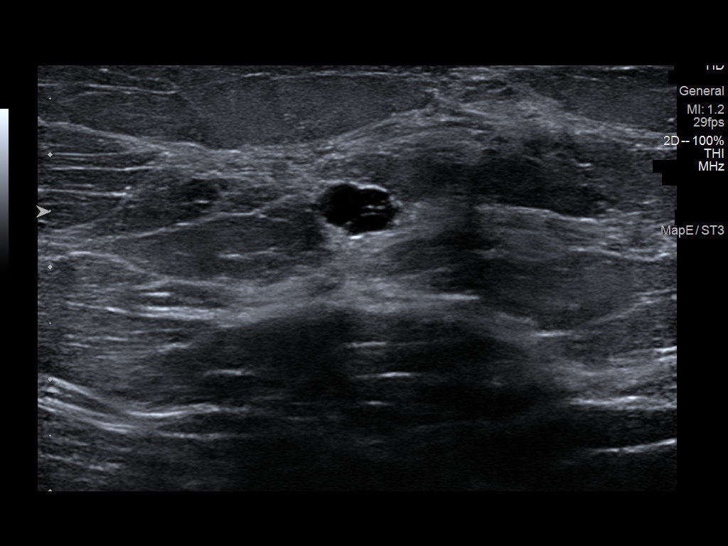
[im 4/11]
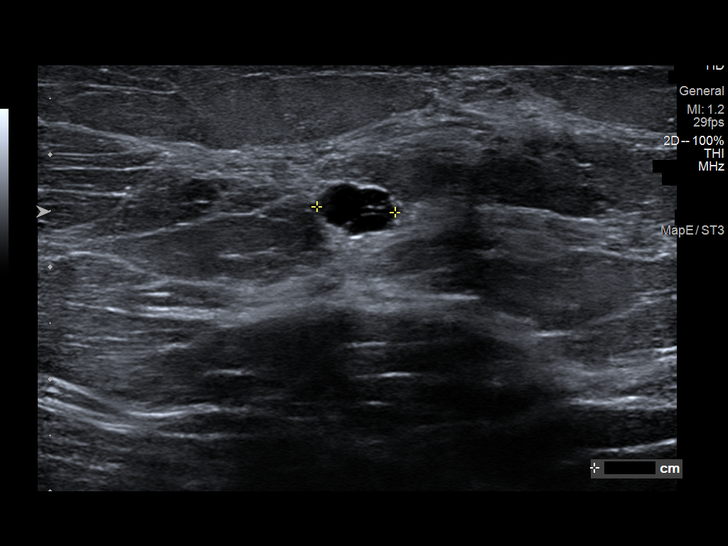
[im 5/11]
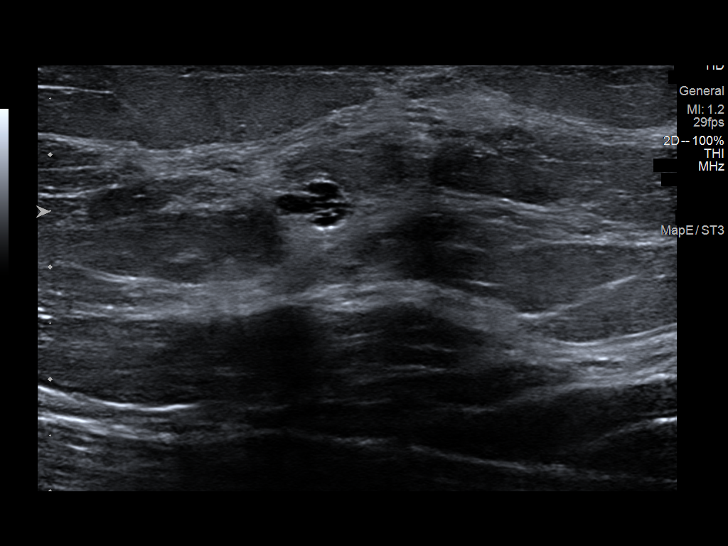
[im 6/11]
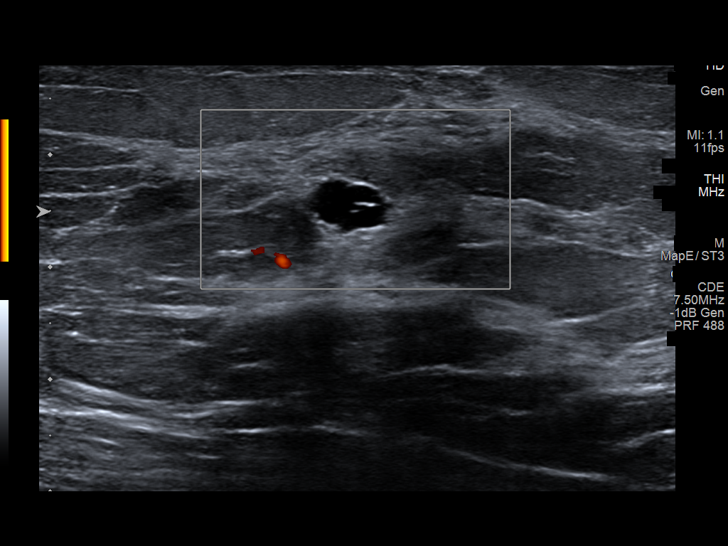
[im 7/11]
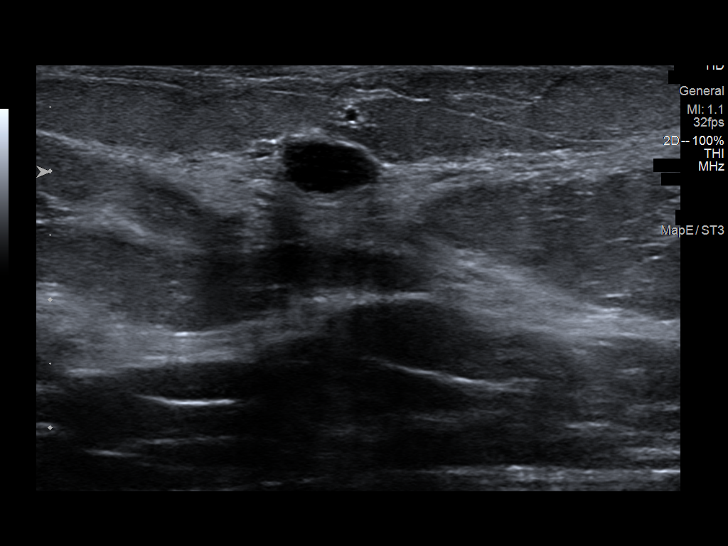
[im 8/11]
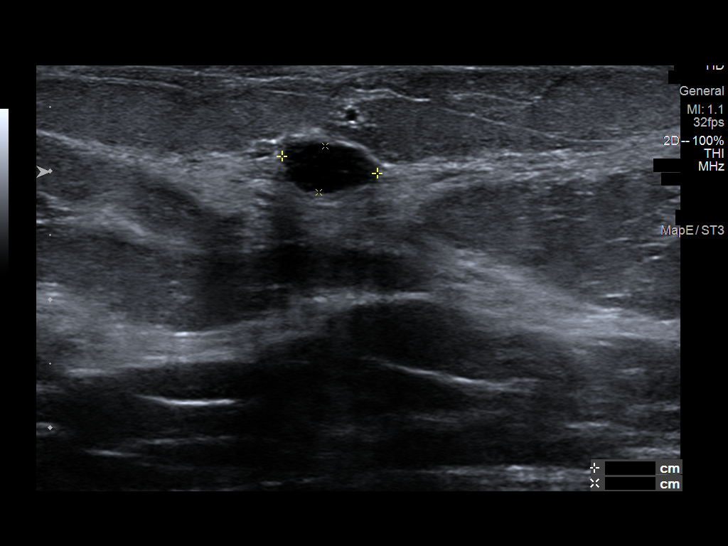
[im 9/11]
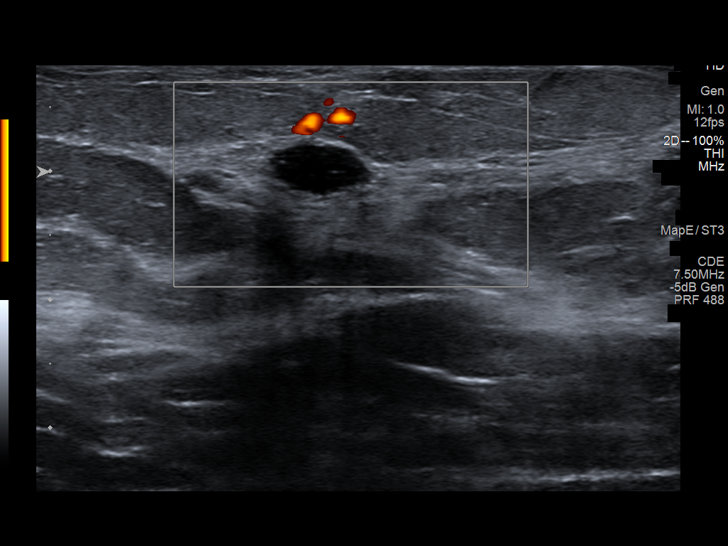
[im 10/11]
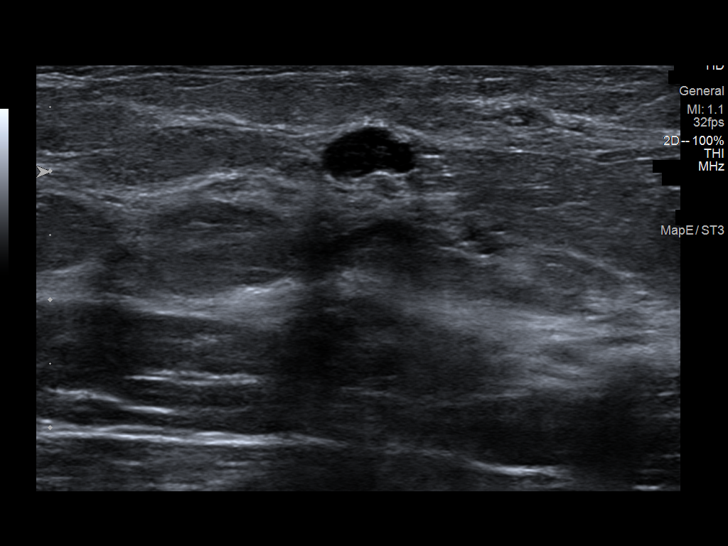
[im 11/11]
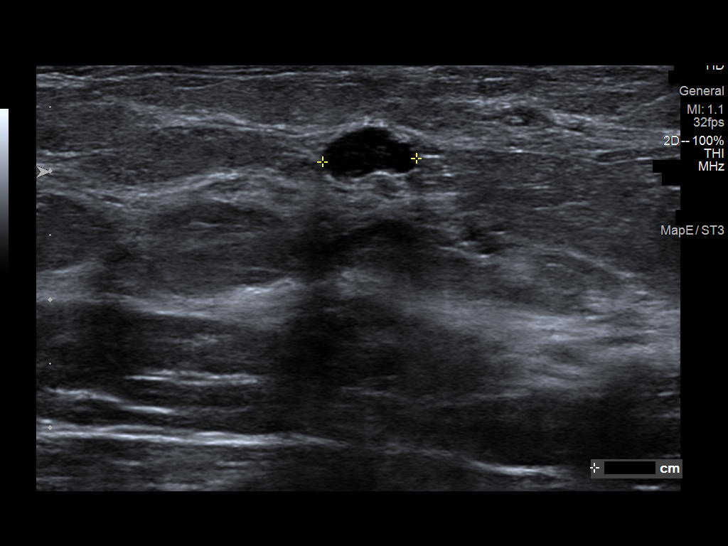

[11 of 11 positions shown; findings below may reference images not displayed]

ACR Breast Density Category c: The breast tissue is heterogeneously
dense, which may obscure small masses.
FINDINGS: Spot compression tomosynthesis images through the lateral right
breast demonstrates a cluster of oval masses. Spot compression
tomosynthesis images through the medial anterior left breast
demonstrates at least 2 obscured oval masses. No persistent evidence
of distortion is seen in the left breast.

Mammographic images were processed with CAD.

Ultrasound targeted to the right breast at [DATE], 5 cm from the
nipple demonstrates a bilobed anechoic oval circumscribed mass with
posterior acoustic enhancement. The mass measures 0.8 x 0.2 x
cm.

Ultrasound of the left breast at 12 o'clock, 2 cm from the nipple
demonstrates a cluster of benign cysts measuring 0.7 x 0.4 x 0.7 cm.
A similar appearing benign cyst is seen at 10 o'clock, 2 cm from the
nipple measuring 0.8 x 0.4 x 0.7 cm.
IMPRESSION: 1. The masses in the bilateral breasts are consistent with benign
cysts.

2. Resolution of the questioned distortion in the left breast
consistent with overlapping fibroglandular tissue.

RECOMMENDATION:
Screening mammogram in one year.(Code:D7-A-AAF)

I have discussed the findings and recommendations with the patient.
If applicable, a reminder letter will be sent to the patient
regarding the next appointment.

BI-RADS CATEGORY  2: Benign.

## 2020-10-23 IMAGING — MG DIGITAL DIAGNOSTIC BILAT W/ TOMO W/ CAD
6 of 10 series · 6 of 30 positions shown · non-contrast
Comparison: Previous exam(s).

CLINICAL DATA: Screening recall from baseline for a possible right
breast mass, a left breast possible distortion and 2 possible masses
in the left breast.

EXAM:
DIGITAL DIAGNOSTIC BILATERAL MAMMOGRAM WITH TOMO AND CAD; ULTRASOUND
RIGHT BREAST LIMITED; ULTRASOUND LEFT BREAST LIMITED

[L MLO synth-2D]
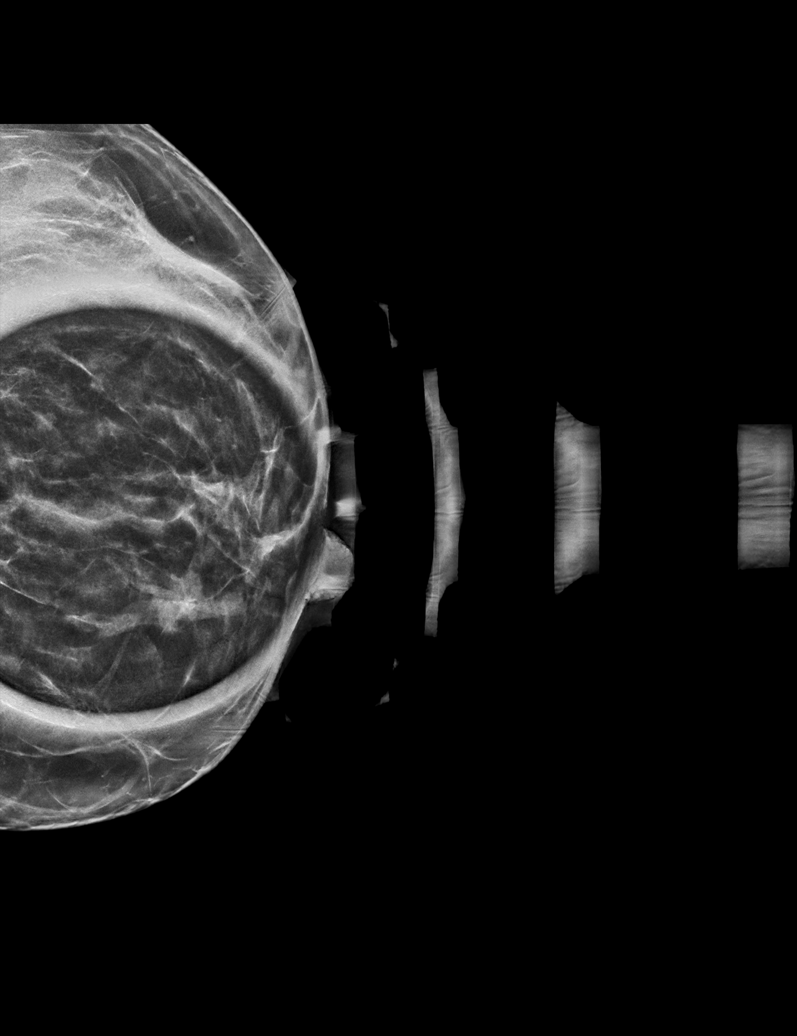

[L CC synth-2D]
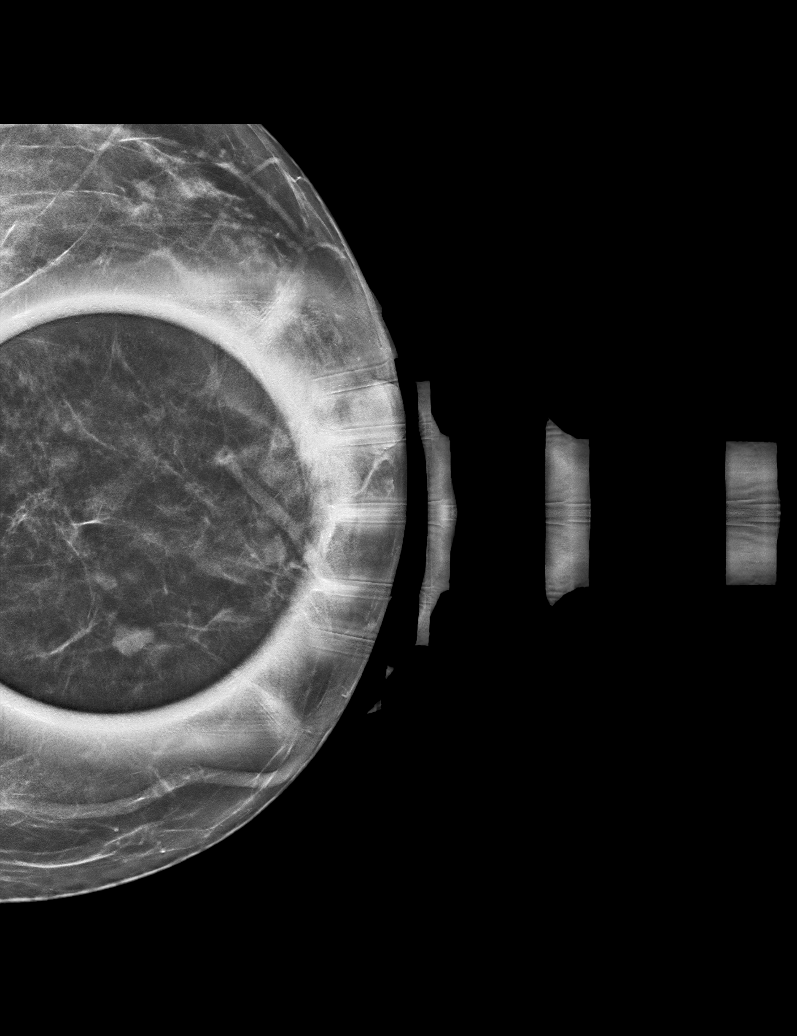

[R CC synth-2D]
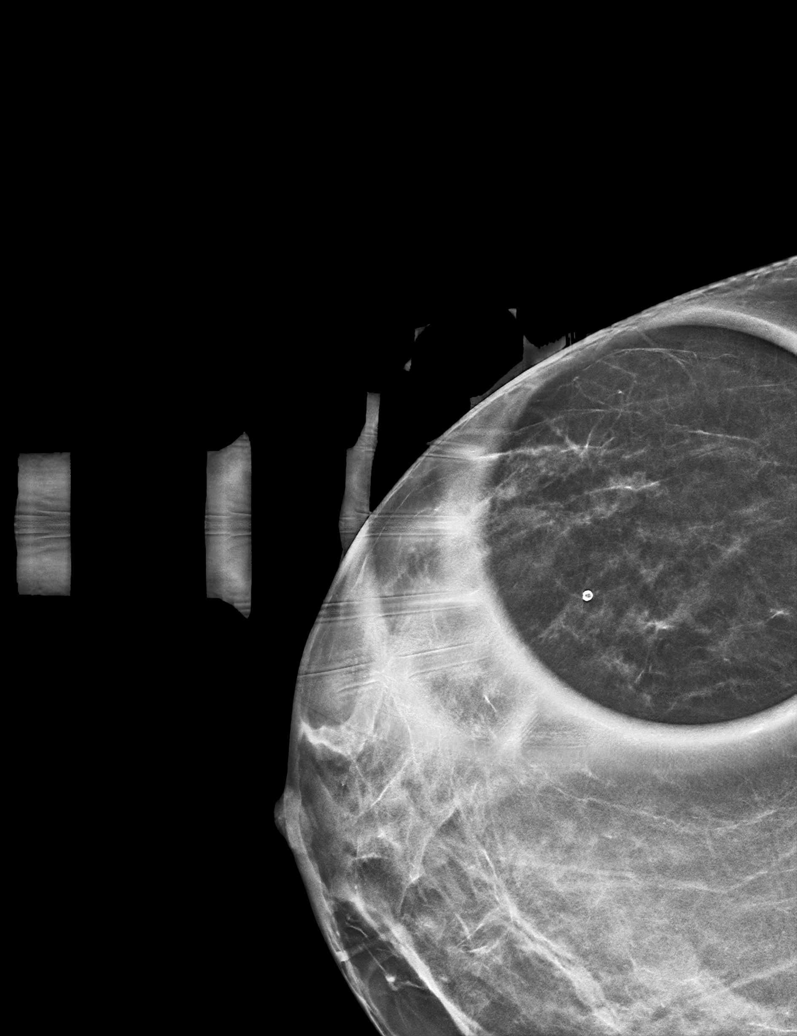

[R MLO synth-2D]
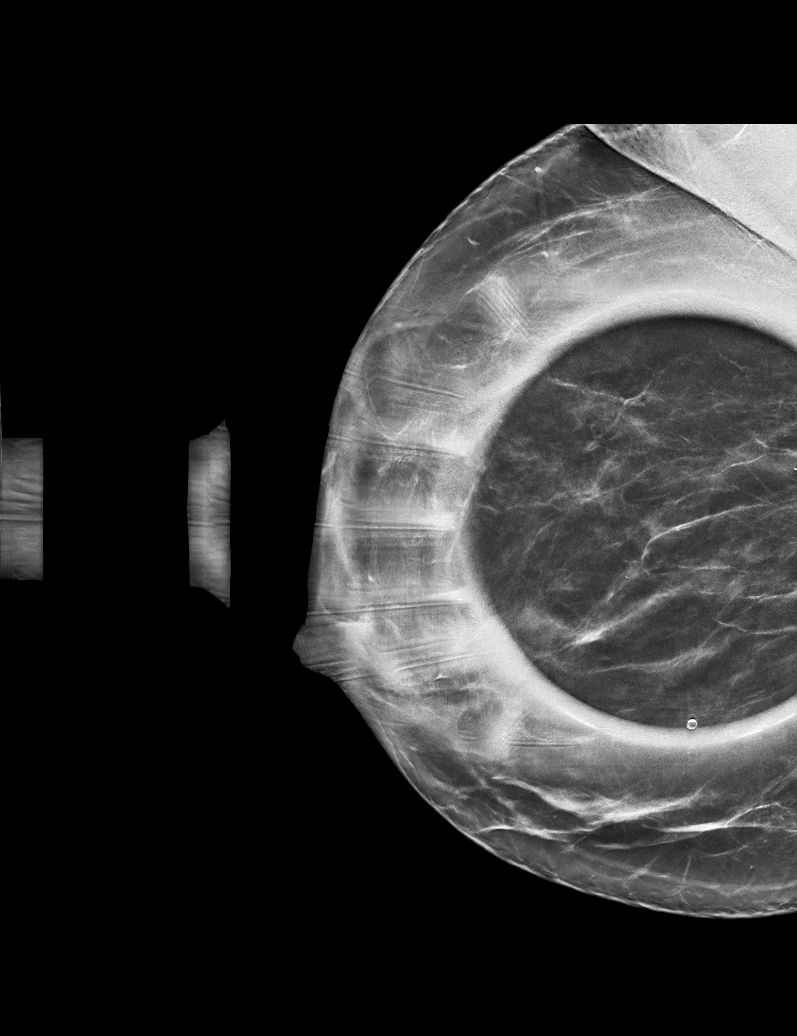

[L ML synth-2D]
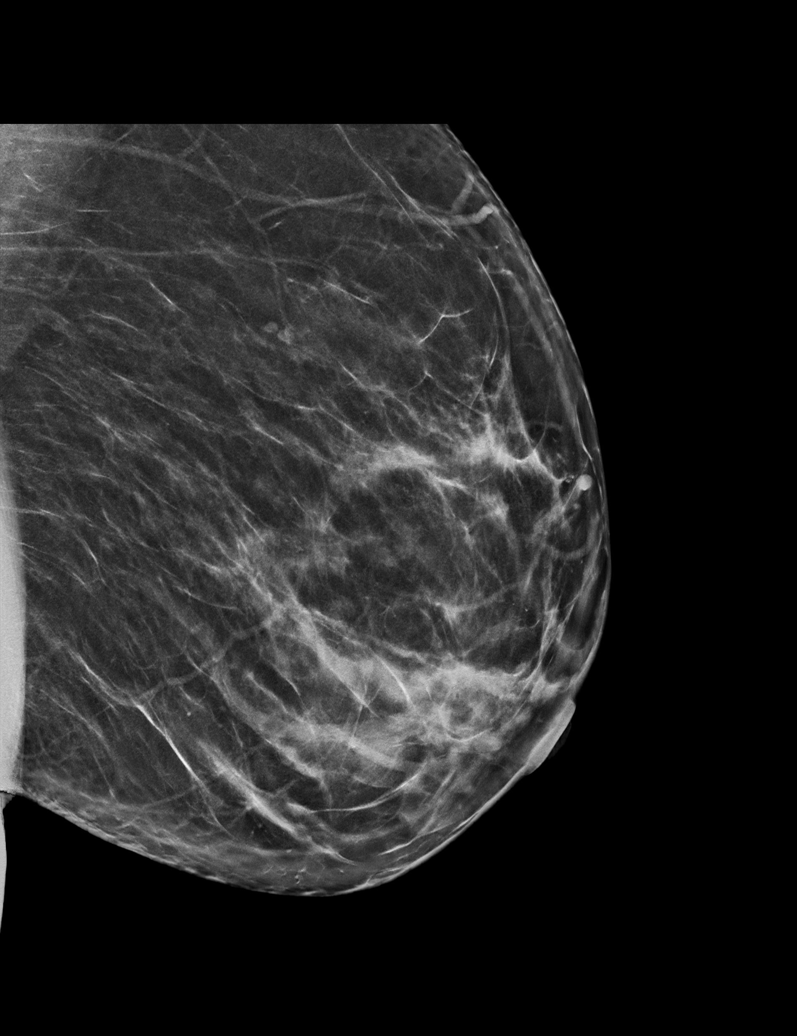

[L CC tomo · tomo slice 25/50.0]
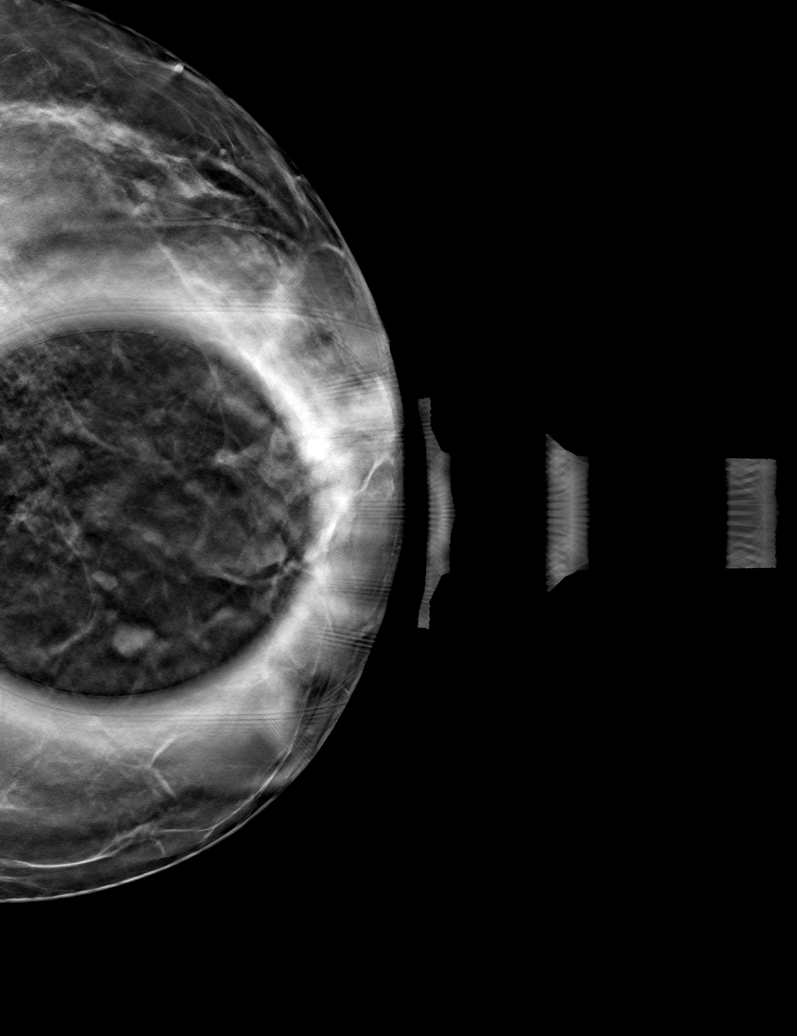

[6 of 30 positions shown; findings below may reference images not displayed]

ACR Breast Density Category c: The breast tissue is heterogeneously
dense, which may obscure small masses.
FINDINGS: Spot compression tomosynthesis images through the lateral right
breast demonstrates a cluster of oval masses. Spot compression
tomosynthesis images through the medial anterior left breast
demonstrates at least 2 obscured oval masses. No persistent evidence
of distortion is seen in the left breast.

Mammographic images were processed with CAD.

Ultrasound targeted to the right breast at [DATE], 5 cm from the
nipple demonstrates a bilobed anechoic oval circumscribed mass with
posterior acoustic enhancement. The mass measures 0.8 x 0.2 x
cm.

Ultrasound of the left breast at 12 o'clock, 2 cm from the nipple
demonstrates a cluster of benign cysts measuring 0.7 x 0.4 x 0.7 cm.
A similar appearing benign cyst is seen at 10 o'clock, 2 cm from the
nipple measuring 0.8 x 0.4 x 0.7 cm.
IMPRESSION: 1. The masses in the bilateral breasts are consistent with benign
cysts.

2. Resolution of the questioned distortion in the left breast
consistent with overlapping fibroglandular tissue.

RECOMMENDATION:
Screening mammogram in one year.(Code:D7-A-AAF)

I have discussed the findings and recommendations with the patient.
If applicable, a reminder letter will be sent to the patient
regarding the next appointment.

BI-RADS CATEGORY  2: Benign.

## 2021-08-28 ENCOUNTER — Emergency Department (HOSPITAL_COMMUNITY)
Admission: EM | Admit: 2021-08-28 | Discharge: 2021-08-29 | Disposition: A | Discharge status: Home / Self Care | Payer: BC Managed Care – PPO | Attending: Emergency Medicine | Admitting: Emergency Medicine

## 2021-08-28 ENCOUNTER — Other Ambulatory Visit: Payer: Self-pay

## 2021-08-28 ENCOUNTER — Encounter (HOSPITAL_COMMUNITY): Payer: Self-pay | Admitting: Emergency Medicine

## 2021-08-28 DIAGNOSIS — R112 Nausea with vomiting, unspecified: Secondary | ICD-10-CM | POA: Insufficient documentation

## 2021-08-28 DIAGNOSIS — G43909 Migraine, unspecified, not intractable, without status migrainosus: Secondary | ICD-10-CM | POA: Insufficient documentation

## 2021-08-28 DIAGNOSIS — Z5321 Procedure and treatment not carried out due to patient leaving prior to being seen by health care provider: Secondary | ICD-10-CM | POA: Insufficient documentation

## 2021-08-28 DIAGNOSIS — H53149 Visual discomfort, unspecified: Secondary | ICD-10-CM | POA: Insufficient documentation

## 2021-08-28 MED ORDER — IBUPROFEN 400 MG PO TABS
600.0000 mg | ORAL_TABLET | Freq: Once | ORAL | Status: AC
  Administered 2021-08-28: 600 mg via ORAL
  Filled 2021-08-28: qty 1

## 2021-08-28 MED ORDER — ONDANSETRON 4 MG PO TBDP
8.0000 mg | ORAL_TABLET | Freq: Once | ORAL | Status: AC
  Administered 2021-08-28: 8 mg via ORAL
  Filled 2021-08-28: qty 2

## 2021-08-28 NOTE — ED Triage Notes (Signed)
Patient reports persistent left temporal migraine headache for several days unrelieved by OTC pain medications , occasional emesis and photophobia .  ?

## 2021-08-28 NOTE — ED Provider Triage Note (Signed)
Emergency Medicine Provider Triage Evaluation Note ? ?Tanya Ingram , a 46 y.o. female  was evaluated in triage.  Pt complains of headache.  Headache has been present for several days.  It is a left temporal headache.  She is associated photophobia, nausea, and vomiting.  She has had migraines in the past but this 1 is a little bit worse than normal however has the same pattern.  She said actually went away for little bit last night but came back today.  She has not taken anything for her pain. ? ?Review of Systems  ?Positive:  ?Negative:  ? ?Physical Exam  ?BP 125/85 (BP Location: Right Arm)   Pulse 78   Temp 98.5 ?F (36.9 ?C) (Oral)   Resp 18   Ht 5\' 8"  (1.727 m)   Wt 107 kg   SpO2 98%   BMI 35.87 kg/m?  ?Gen:   Awake, no distress   ?Resp:  Normal effort  ?MSK:   Moves extremities without difficulty  ?Other:  No focal neuro deficits. ? ?Medical Decision Making  ?Medically screening exam initiated at 8:11 PM.  Appropriate orders placed.  KENSINGTON RIOS was informed that the remainder of the evaluation will be completed by another provider, this initial triage assessment does not replace that evaluation, and the importance of remaining in the ED until their evaluation is complete. ? ? ?  ?Tanya Leash, PA-C ?08/28/21 2012 ? ?

## 2021-08-29 NOTE — ED Notes (Signed)
Pt states she is leaving  

## 2023-05-14 ENCOUNTER — Encounter (HOSPITAL_BASED_OUTPATIENT_CLINIC_OR_DEPARTMENT_OTHER): Payer: Self-pay

## 2023-05-14 ENCOUNTER — Other Ambulatory Visit: Payer: Self-pay

## 2023-05-14 DIAGNOSIS — R55 Syncope and collapse: Secondary | ICD-10-CM | POA: Diagnosis present

## 2023-05-14 DIAGNOSIS — R0789 Other chest pain: Secondary | ICD-10-CM | POA: Insufficient documentation

## 2023-05-14 DIAGNOSIS — R002 Palpitations: Secondary | ICD-10-CM | POA: Insufficient documentation

## 2023-05-14 DIAGNOSIS — R110 Nausea: Secondary | ICD-10-CM | POA: Diagnosis not present

## 2023-05-14 DIAGNOSIS — R61 Generalized hyperhidrosis: Secondary | ICD-10-CM | POA: Diagnosis not present

## 2023-05-14 DIAGNOSIS — R197 Diarrhea, unspecified: Secondary | ICD-10-CM | POA: Insufficient documentation

## 2023-05-14 LAB — BASIC METABOLIC PANEL
Anion gap: 9 (ref 5–15)
BUN: 18 mg/dL (ref 6–20)
CO2: 25 mmol/L (ref 22–32)
Calcium: 10.3 mg/dL (ref 8.9–10.3)
Chloride: 103 mmol/L (ref 98–111)
Creatinine, Ser: 0.69 mg/dL (ref 0.44–1.00)
GFR, Estimated: >60 mL/min (ref >60)
Glucose, Bld: 120 mg/dL — ABNORMAL HIGH (ref 70–99)
Potassium: 3.6 mmol/L (ref 3.5–5.1)
Sodium: 137 mmol/L (ref 135–145)

## 2023-05-14 LAB — CBC
HCT: 36.2 % (ref 36.0–46.0)
Hemoglobin: 12.6 g/dL (ref 12.0–15.0)
MCH: 32.1 pg (ref 26.0–34.0)
MCHC: 34.8 g/dL (ref 30.0–36.0)
MCV: 92.1 fL (ref 80.0–100.0)
Platelets: 285 10*3/uL (ref 150–400)
RBC: 3.93 MIL/uL (ref 3.87–5.11)
RDW: 12.5 % (ref 11.5–15.5)
WBC: 11.8 10*3/uL — ABNORMAL HIGH (ref 4.0–10.5)
nRBC: 0 % (ref 0.0–0.2)

## 2023-05-14 NOTE — ED Triage Notes (Signed)
Complaining of passing out at her desk tonight, happened around 945 and 10 pm. The episode lasted about 4 minutes. Said since then her heart has felt like it is racing and she feels dizzy. Had some nausea and diarrhea.

## 2023-05-15 ENCOUNTER — Emergency Department (HOSPITAL_BASED_OUTPATIENT_CLINIC_OR_DEPARTMENT_OTHER): Payer: Medicaid Other

## 2023-05-15 ENCOUNTER — Emergency Department (HOSPITAL_BASED_OUTPATIENT_CLINIC_OR_DEPARTMENT_OTHER)
Admission: EM | Admit: 2023-05-15 | Discharge: 2023-05-15 | Disposition: A | Discharge status: Home / Self Care | Payer: Medicaid Other | Attending: Emergency Medicine | Admitting: Emergency Medicine

## 2023-05-15 DIAGNOSIS — R002 Palpitations: Secondary | ICD-10-CM

## 2023-05-15 DIAGNOSIS — R55 Syncope and collapse: Secondary | ICD-10-CM

## 2023-05-15 LAB — PREGNANCY, URINE: Preg Test, Ur: NEGATIVE

## 2023-05-15 LAB — URINALYSIS, ROUTINE W REFLEX MICROSCOPIC
Bilirubin Urine: NEGATIVE
Glucose, UA: NEGATIVE mg/dL
Hgb urine dipstick: NEGATIVE
Ketones, ur: NEGATIVE mg/dL
Leukocytes,Ua: NEGATIVE
Nitrite: NEGATIVE
Protein, ur: NEGATIVE mg/dL
Specific Gravity, Urine: 1.011 (ref 1.005–1.030)
pH: 6 (ref 5.0–8.0)

## 2023-05-15 LAB — TROPONIN I (HIGH SENSITIVITY)
Troponin I (High Sensitivity): <2 ng/L (ref <18)
Troponin I (High Sensitivity): <2 ng/L (ref <18)

## 2023-05-15 LAB — D-DIMER, QUANTITATIVE: D-Dimer, Quant: <0.27 ug{FEU}/mL (ref 0.00–0.50)

## 2023-05-15 NOTE — ED Provider Notes (Signed)
[place] EMERGENCY DEPARTMENT AT [facility] Provider Note   CSN: [id number] Arrival date & time: 05/14/23  2250     History  Chief Complaint  Patient presents with   Near Syncope    Tanya Ingram is a 47 y.o. female.  HPI     This is a 47 year old female who presents with an episode of palpitations and syncope.  Patient reports that she was sitting at her desk when she had sudden onset of palpitations and chest discomfort.  She then woke up with her head on her computer.  She states she does not necessarily remember putting her head down.  She thinks she may have passed out.  This is never happened to her before.  States that she felt nauseous and lightheaded.  She states she woke up and was diaphoretic and had some diarrhea.  Prior to this episode states she felt well.  No recent illnesses.  She does have a smoking history.  Home Medications Prior to Admission medications   Medication Sig Start Date End Date Taking? Authorizing Provider  Aspirin-Acetaminophen-Caffeine (EXCEDRIN PO) Take 1 capsule by mouth as needed. 3-4 times weekly    [provider]  lidocaine (XYLOCAINE) 5 % ointment Apply 1 application topically 4 (four) times daily as needed. 03/29/20   Linwood Dibbles, MD  nitrofurantoin, macrocrystal-monohydrate, (MACROBID) 100 MG capsule Take 1 capsule (100 mg total) by mouth 2 (two) times daily. 12/07/17   Wallis Bamberg, PA-C  pramoxine (PROCTOFOAM) 1 % foam Place 1 application rectally 3 (three) times daily as needed for hemorrhoids. 03/29/20   Linwood Dibbles, MD      Allergies    Nickel and Percocet [oxycodone-acetaminophen]    Review of Systems   Review of Systems  Constitutional:  Negative for fever.  Respiratory:  Negative for shortness of breath.   Cardiovascular:  Positive for chest pain and palpitations.  Neurological:  Positive for syncope.  All other systems reviewed and are negative.   Physical Exam Updated Vital Signs BP (!) 93/58    Pulse 67   Temp 98 F (36.7 C)   Resp 16   Ht 1.727 m (5\' 8" )   Wt 104.3 kg   SpO2 95%   BMI 34.97 kg/m  Physical Exam Vitals and nursing note reviewed.  Constitutional:      Appearance: She is well-developed. She is not ill-appearing.  HENT:     Head: Normocephalic and atraumatic.  Eyes:     Pupils: Pupils are equal, round, and reactive to light.  Cardiovascular:     Rate and Rhythm: Normal rate and regular rhythm.     Heart sounds: Normal heart sounds.  Pulmonary:     Effort: Pulmonary effort is normal. No respiratory distress.     Breath sounds: No wheezing.  Abdominal:     General: Bowel sounds are normal.     Palpations: Abdomen is soft.  Musculoskeletal:     Cervical back: Neck supple.  Skin:    General: Skin is warm and dry.  Neurological:     Mental Status: She is alert and oriented to person, place, and time.  Psychiatric:        Mood and Affect: Mood normal.     ED Results / Procedures / Treatments   Labs (all labs ordered are listed, but only abnormal results are displayed) Labs Reviewed  BASIC METABOLIC PANEL - Abnormal; Notable for the following components:      Result Value   Glucose, Bld 120 (*)  All other components within normal limits  CBC - Abnormal; Notable for the following components:   WBC 11.8 (*)    All other components within normal limits  URINALYSIS, ROUTINE W REFLEX MICROSCOPIC  PREGNANCY, URINE  D-DIMER, QUANTITATIVE  CBG MONITORING, ED  TROPONIN I (HIGH SENSITIVITY)  TROPONIN I (HIGH SENSITIVITY)    EKG EKG Interpretation Date/Time:  Tuesday May 14 2023 23:18:18 EST Ventricular Rate:  89 PR Interval:  146 QRS Duration:  86 QT Interval:  346 QTC Calculation: 420 R Axis:   74  Text Interpretation: Normal sinus rhythm with sinus arrhythmia Normal ECG No previous ECGs available Confirmed by Ross Marcus ([id number]) on 05/15/2023 2:32:37 AM  Radiology DG Chest Portable 1 View  Result Date: 05/15/2023 CLINICAL  DATA:  Palpitations. EXAM: PORTABLE CHEST 1 VIEW COMPARISON:  June 24, 2004 FINDINGS: The heart size and mediastinal contours are within normal limits. Both lungs are clear. The visualized skeletal structures are unremarkable. IMPRESSION: No active disease. Electronically Signed   By: Aram Candela M.D.   On: 05/15/2023 02:04    Procedures Procedures    Medications Ordered in ED Medications - No data to display  ED Course/ Medical Decision Making/ A&P Clinical Course as of 05/15/23 0250  Wed May 15, 2023  0225 Patient resting comfortably.  No events noted on the monitor.  She is currently asymptomatic.  Discussed close follow-up with cardiology given reassuring workup here.  Patient is agreeable to plan. [CH]    Clinical Course User Index [CH] Shiv Shuey, Mayer Masker, MD                                 Medical Decision Making Amount and/or Complexity of Data Reviewed Labs: ordered. Radiology: ordered.   This patient presents to the ED for concern of palpitations, syncope/near syncope, this involves an extensive number of treatment options, and is a complaint that carries with it a high risk of complications and morbidity.  I considered the following differential and admission for this acute, potentially life threatening condition.  The differential diagnosis includes arrhythmia, vasovagal episode, ACS, PE, pneumothorax  MDM:    This is a 47 year old female who presents with an episode of palpitations and syncope versus near syncope.  Does report loss of consciousness but had a prodrome leading up to either laying her head down or passing out.  She is overall nontoxic-appearing.  No history of the same.  No recent illnesses.  She does report some chest discomfort and diaphoresis.  EKG shows no evidence of acute arrhythmia or ischemia.  Troponin x 2 negative.  Doubt primary ACS.  D-dimer was sent to screen for PE.  This was also negative.  No significant metabolic derangements.  Workup  today has been largely unremarkable.  She has been on the cardiac monitor without any obvious events.  Could have been a vasovagal episode; however with palpitations and chest discomfort, would have her follow-up closely with cardiology.  Her risk factors include smoking and BMI.  (Labs, imaging, consults)  Labs: I Ordered, and personally interpreted labs.  The pertinent results include: CBC, BMP, D-dimer, troponin x 2, pregnancy, urinalysis  Imaging Studies ordered: I ordered imaging studies including chest x-ray I independently visualized and interpreted imaging. I agree with the radiologist interpretation  Additional history obtained from chart review.  External records from outside source obtained and reviewed including prior evaluations  Cardiac Monitoring: The patient was maintained on a  cardiac monitor.  If on the cardiac monitor, I personally viewed and interpreted the cardiac monitored which showed an underlying rhythm of: Sinus  Reevaluation: After the interventions noted above, I reevaluated the patient and found that they have :improved  Social Determinants of Health:  lives independently  Disposition: Discharge  Co morbidities that complicate the patient evaluation  Past Medical History:  Diagnosis Date   Chronic tension headaches    Depression      Medicines No orders of the defined types were placed in this encounter.   I have reviewed the patients home medicines and have made adjustments as needed  Problem List / ED Course: Problem List Items Addressed This Visit   None Visit Diagnoses     Palpitations    -  Primary   Relevant Orders   Ambulatory referral to Cardiology   Near syncope                       Final Clinical Impression(s) / ED Diagnoses Final diagnoses:  Palpitations  Near syncope    Rx / DC Orders ED Discharge Orders          Ordered    Ambulatory referral to Cardiology        05/15/23 0242               Shon Baton, MD 05/15/23 [phone]

## 2023-05-15 NOTE — Discharge Instructions (Signed)
You were seen today for near syncope and palpitations.  You need to follow-up with cardiology.  A referral has been placed.  Make sure that you are staying hydrated at home.  If you have recurrent chest pain, any new or worsening symptoms, you should be reevaluated.

## 2023-06-05 NOTE — Progress Notes (Signed)
Cardiology Office Note:    Date:  06/10/2023   ID:  Tanya Ingram, DOB 17-Feb-1976, MRN [id number]  PCP:  Porfirio Oar, PA   [facility] Providers Cardiologist:  None     Referring MD: Porfirio Oar, PA   Chief Complaint  Patient presents with   Palpitations   Loss of Consciousness    History of Present Illness:    Tanya Ingram is a 47 y.o. female seen at the request of Porfirio Oar PA for evaluation of palpitations and syncopal episode. She states that on 11/20 she was sitting at her desk when she suddenly felt her heart racing followed by sweating and tunnel vision before passing out. Thinks she was out < 5 minutes. When she came to she still had palpitations and SOB. Went to ED where evaluation was normal including BMET, troponins, D dimer, CBC, CXR and Ecg. No recurrent spells since then. No prior history of arrhythmia or syncope. She does smoke 1/2 pk/day. Drinks on caffienated soda per day and takes Excedrin occasionally for HA. She does have a watch that records HR and since then has noted HR from 31-192. No rhythm strip available.   Past Medical History:  Diagnosis Date   Chronic tension headaches    Depression    Gestational diabetes     Past Surgical History:  Procedure Laterality Date   DILATION AND CURETTAGE OF UTERUS     EYE SURGERY  2000   Lasix    Current Medications: Current Meds  Medication Sig   Aspirin-Acetaminophen-Caffeine (EXCEDRIN PO) Take 1 capsule by mouth as needed. 3-4 times weekly   Biotin 1000 MCG CHEW Chew 6 mg by mouth in the morning and at bedtime. Patient stated that she takes 6,000 a day   clobetasol cream (TEMOVATE) 0.05 % Apply 1 Application topically 2 (two) times daily. Patient stated she use a few times a month   magnesium oxide (MAG-OX) 400 (240 Mg) MG tablet Take 400 mg by mouth daily. Patient takes 2 tablets daily     Allergies:   Nickel and Percocet [oxycodone-acetaminophen]   Social History    Socioeconomic History   Marital status: Married    Spouse name: Not on file   Number of children: 2   Years of education: Not on file   Highest education level: Not on file  Occupational History   Occupation: customer service    Employer: [employer]  Tobacco Use   Smoking status: Every Day    Current packs/day: 1.00    Average packs/day: 1 pack/day for 20.0 years (20.0 ttl pk-yrs)    Types: Cigarettes   Smokeless tobacco: Never   Tobacco comments:    Switch between vaping and cigarettes   Vaping Use   Vaping status: Never Used  Substance and Sexual Activity   Alcohol use: Yes    Alcohol/week: 1.0 standard drink of alcohol    Types: 1 Standard drinks or equivalent per week    Comment: socially   Drug use: Yes    Types: Marijuana   Sexual activity: Yes    Birth control/protection: I.U.D.    Comment: INTERCOURSE AGE 28, SEXUAL PARTNERS MORE THAN 5  Other Topics Concern   Not on file  Social History Narrative   Lives here in [place] with her husband and two sons.   Social Drivers of Health   Financial Resource Strain: Low Risk  (05/16/2021)   Received from [facility]   Overall Financial Resource Strain (CARDIA)  Difficulty of Paying Living Expenses: Not very hard  Food Insecurity: No Food Insecurity (05/16/2021)   Received from [facility]   Hunger Vital Sign    Worried About Running Out of Food in the Last Year: Never true    Ran Out of Food in the Last Year: Never true  Transportation Needs: No Transportation Needs (05/16/2021)   Received from [facility] - Transportation    Lack of Transportation (Medical): No    Lack of Transportation (Non-Medical): No  Physical Activity: Insufficiently Active (05/16/2021)   Received from [facility]   Exercise Vital Sign    Days of Exercise per Week: 1 day    Minutes of Exercise per Session: 50 min  Stress: Stress Concern Present (05/16/2021)   Received from [facility]  [facility]upational [facility] - Occupational Stress Questionnaire    Feeling of Stress : Rather much  Social Connections: Unknown (11/06/2021)   Received from [facility]   Social Network    Social Network: Not on file     Family History: The patient's family history includes Arrhythmia in her brother; Diabetes in her brother; Gallstones in her mother; Heart disease in her mother; Stroke in her maternal grandmother and paternal grandmother.  ROS:   Please see the history of present illness.     All other systems reviewed and are negative.  EKGs/Labs/Other Studies Reviewed:    The following studies were reviewed today: Reviewed records from ED      Recent Labs: 05/14/2023: BUN 18; Creatinine, Ser 0.69; Hemoglobin 12.6; Platelets 285; Potassium 3.6; Sodium 137  Recent Lipid Panel    Component Value Date/Time   CHOL 162 03/15/2017 1552   TRIG 100 03/15/2017 1552   HDL 43 03/15/2017 1552   CHOLHDL 3.8 03/15/2017 1552   CHOLHDL 4.2 02/18/2015 1418   VLDL 32 (H) 02/18/2015 1418   LDLCALC 99 03/15/2017 1552     Risk Assessment/Calculations:                Physical Exam:    VS:  BP 112/73 (BP Location: Left Arm, Patient Position: Sitting, Cuff Size: Normal)   Pulse 92   Ht 5\' 8"  (1.727 m)   Wt 206 lb (93.4 kg)   SpO2 92%   BMI 31.32 kg/m     Wt Readings from Last 3 Encounters:  06/10/23 206 lb (93.4 kg)  05/14/23 230 lb (104.3 kg)  08/28/21 235 lb 14.3 oz (107 kg)     GEN:  Well nourished, well developed in no acute distress HEENT: Normal NECK: No JVD; No carotid bruits LYMPHATICS: No lymphadenopathy CARDIAC: RRR, no murmurs, rubs, gallops RESPIRATORY:  Clear to auscultation without rales, wheezing or rhonchi  ABDOMEN: Soft, non-tender, non-distended MUSCULOSKELETAL:  No edema; No deformity  SKIN: Warm and dry NEUROLOGIC:  Alert and oriented x 3 PSYCHIATRIC:  Normal affect   ASSESSMENT:    1. Syncope and collapse   2. Palpitations    PLAN:    In order of  problems listed above:  Single episode of syncope associated with palpitations raising the concern of primary arrhythmia. Recommend a 2 week Zio patch monitor.  Will check TSH and T4. Recommend avoidance of stimulants such as nicotine and caffeine. Will follow up after above studies.            Medication Adjustments/Labs and Tests Ordered: Current medicines are reviewed at length with the patient today.  Concerns regarding medicines are outlined above.  Orders  Placed This Encounter  Procedures   TSH   T4, free   LONG TERM MONITOR (3-14 DAYS)   No orders of the defined types were placed in this encounter.   Patient Instructions  Medication Instructions:  Continue same medications   Lab Work: Tsh,free t4 today   Testing/Procedures: 2 week heart monitor will be mailed to your home   Follow-Up: At [facility], you and your health needs are our priority.  As part of our continuing mission to provide you with exceptional heart care, we have created designated Provider Care Teams.  These Care Teams include your primary Cardiologist (physician) and Advanced Practice Providers (APPs -  Physician Assistants and Nurse Practitioners) who all work together to provide you with the care you need, when you need it.  We recommend signing up for the patient portal called "MyChart".  Sign up information is provided on this After Visit Summary.  MyChart is used to connect with patients for Virtual Visits (Telemedicine).  Patients are able to view lab/test results, encounter notes, upcoming appointments, etc.  Non-urgent messages can be sent to your provider as well.   To learn more about what you can do with MyChart, go to [email or web address].    Your next appointment:  To be determined     Provider:  Dr.Donnisha Besecker     Avoid stimulants,caffeine,decongestants     Signed, Mitsue Peery [place], MD  06/10/2023 12:50 PM    [facility]

## 2023-06-10 ENCOUNTER — Encounter: Payer: Self-pay | Admitting: Cardiology

## 2023-06-10 ENCOUNTER — Ambulatory Visit: Payer: Medicaid Other | Attending: Cardiology | Admitting: Cardiology

## 2023-06-10 ENCOUNTER — Ambulatory Visit (INDEPENDENT_AMBULATORY_CARE_PROVIDER_SITE_OTHER): Payer: Medicaid Other

## 2023-06-10 VITALS — BP 112/73 | HR 92 | Ht 68.0 in | Wt 206.0 lb

## 2023-06-10 DIAGNOSIS — R55 Syncope and collapse: Secondary | ICD-10-CM

## 2023-06-10 DIAGNOSIS — R002 Palpitations: Secondary | ICD-10-CM

## 2023-06-10 NOTE — Progress Notes (Unsigned)
Enrolled for [employer] to mail a ZIO XT long term holter monitor to the patients address on file.  

## 2023-06-10 NOTE — Patient Instructions (Addendum)
Medication Instructions:  Continue same medications   Lab Work: Tsh,free t4 today   Testing/Procedures: 2 week heart monitor will be mailed to your home   Follow-Up: At [facility], you and your health needs are our priority.  As part of our continuing mission to provide you with exceptional heart care, we have created designated Provider Care Teams.  These Care Teams include your primary Cardiologist (physician) and Advanced Practice Providers (APPs -  Physician Assistants and Nurse Practitioners) who all work together to provide you with the care you need, when you need it.  We recommend signing up for the patient portal called "MyChart".  Sign up information is provided on this After Visit Summary.  MyChart is used to connect with patients for Virtual Visits (Telemedicine).  Patients are able to view lab/test results, encounter notes, upcoming appointments, etc.  Non-urgent messages can be sent to your provider as well.   To learn more about what you can do with MyChart, go to [email or web address].    Your next appointment:  To be determined     Provider:  Dr.Jordan     Avoid stimulants,caffeine,decongestants

## 2023-06-11 LAB — TSH: TSH: 3.09 u[IU]/mL (ref 0.450–4.500)

## 2023-06-11 LAB — T4, FREE: Free T4: 1 ng/dL (ref 0.82–1.77)

## 2023-06-13 DIAGNOSIS — R002 Palpitations: Secondary | ICD-10-CM | POA: Diagnosis not present

## 2023-06-13 DIAGNOSIS — R55 Syncope and collapse: Secondary | ICD-10-CM | POA: Diagnosis not present

## 2023-07-05 ENCOUNTER — Telehealth: Payer: Self-pay | Admitting: *Deleted

## 2023-07-05 NOTE — Telephone Encounter (Signed)
 Called left message for patient to return call for results . Also need to schedule an Echo per Dr Swaziland

## 2023-07-05 NOTE — Telephone Encounter (Signed)
-----   Message from Peter Jordan sent at 07/04/2023  4:31 PM EST ----- This study demonstrates:  Event monitor with single 6 beat run NSVT. Otherwise benign Medication changes / Follow up studies / Other recommendations:   For completeness I would recommend an Echo. Monitor looks benign  Please send results to the PCP:  Juliane Che, PA  Peter Jordan, MD 07/04/2023 4:29 PM

## 2023-07-10 NOTE — Telephone Encounter (Signed)
 Called patient left message on personal voice mail to call back.

## 2023-07-11 NOTE — Telephone Encounter (Signed)
Called patient left message on personal voice mail to call back. 

## 2023-07-12 ENCOUNTER — Other Ambulatory Visit: Payer: Self-pay

## 2023-07-12 DIAGNOSIS — R002 Palpitations: Secondary | ICD-10-CM

## 2023-07-12 DIAGNOSIS — R55 Syncope and collapse: Secondary | ICD-10-CM

## 2023-07-12 NOTE — Telephone Encounter (Signed)
Already spoke to patient.Echo scheduled 2/19 at 10:00 am at Dr.Jordan's office.

## 2023-08-14 ENCOUNTER — Ambulatory Visit (HOSPITAL_COMMUNITY)
Admission: RE | Admit: 2023-08-14 | Discharge: 2023-08-14 | Disposition: A | Discharge status: Home / Self Care | Payer: Medicaid Other | Source: Ambulatory Visit | Attending: Internal Medicine | Admitting: Internal Medicine

## 2023-08-14 DIAGNOSIS — R55 Syncope and collapse: Secondary | ICD-10-CM | POA: Insufficient documentation

## 2023-08-14 DIAGNOSIS — R002 Palpitations: Secondary | ICD-10-CM | POA: Diagnosis present

## 2023-08-14 LAB — ECHOCARDIOGRAM COMPLETE
AR max vel: 1.94 cm2
AV Area VTI: 2.01 cm2
AV Area mean vel: 1.97 cm2
AV Mean grad: 5 mm[Hg]
AV Peak grad: 9 mm[Hg]
Ao pk vel: 1.5 m/s
Area-P 1/2: 3.12 cm2
MV M vel: 1.1 m/s
MV Peak grad: 4.8 mm[Hg]
S' Lateral: 3.52 cm

## 2023-12-12 ENCOUNTER — Encounter (HOSPITAL_BASED_OUTPATIENT_CLINIC_OR_DEPARTMENT_OTHER): Payer: Self-pay | Admitting: *Deleted

## 2023-12-12 ENCOUNTER — Other Ambulatory Visit: Payer: Self-pay

## 2023-12-12 ENCOUNTER — Emergency Department (HOSPITAL_BASED_OUTPATIENT_CLINIC_OR_DEPARTMENT_OTHER)
Admission: EM | Admit: 2023-12-12 | Discharge: 2023-12-12 | Disposition: A | Discharge status: Home / Self Care | Attending: Emergency Medicine | Admitting: Emergency Medicine

## 2023-12-12 DIAGNOSIS — N9089 Other specified noninflammatory disorders of vulva and perineum: Secondary | ICD-10-CM

## 2023-12-12 DIAGNOSIS — Z7982 Long term (current) use of aspirin: Secondary | ICD-10-CM | POA: Diagnosis not present

## 2023-12-12 DIAGNOSIS — N898 Other specified noninflammatory disorders of vagina: Secondary | ICD-10-CM | POA: Diagnosis present

## 2023-12-12 LAB — WET PREP, GENITAL
Clue Cells Wet Prep HPF POC: NONE SEEN
Sperm: NONE SEEN
Trich, Wet Prep: NONE SEEN
WBC, Wet Prep HPF POC: <10 (ref <10)
Yeast Wet Prep HPF POC: NONE SEEN

## 2023-12-12 LAB — URINALYSIS, W/ REFLEX TO CULTURE (INFECTION SUSPECTED)
Bacteria, UA: NONE SEEN
Bilirubin Urine: NEGATIVE
Glucose, UA: NEGATIVE mg/dL
Hgb urine dipstick: NEGATIVE
Ketones, ur: NEGATIVE mg/dL
Leukocytes,Ua: NEGATIVE
Nitrite: NEGATIVE
Protein, ur: NEGATIVE mg/dL
Specific Gravity, Urine: 1.022 (ref 1.005–1.030)
pH: 6 (ref 5.0–8.0)

## 2023-12-12 NOTE — ED Provider Notes (Cosign Needed)
 [place] EMERGENCY DEPARTMENT AT [facility] Provider Note   CSN: [id number] Arrival date & time: 12/12/23  2147     Patient presents with: No chief complaint on file.   Tanya Ingram is a 48 y.o. female with a past medical history of lichen sclerosis who presents emergency department with pelvic pressure and vulvar discomfort.  Patient reports that she was recently treated for a urinary tract infection and completed a course of her antibiotics.  Since that time she continues to have burning pain on her vulva and discomfort with urination.  She also has urgency to urinate and a little bit of urinary incontinence.  Patient also feels pressure in her pelvis which is worse with lying down.  She is sexually active with a single female partner her husband with whom she has been for the past 20 years.  She is not concerned for any STIs.  She denies back pain flank pain fevers.  She has not concern for pelvic organ prolapse   HPI     Prior to Admission medications   Medication Sig Start Date End Date Taking? Authorizing Provider  Aspirin-Acetaminophen -Caffeine (EXCEDRIN PO) Take 1 capsule by mouth as needed. 3-4 times weekly    [provider]  Biotin 1000 MCG CHEW Chew 6 mg by mouth in the morning and at bedtime. Patient stated that she takes 6,000 a day    [provider]  clobetasol cream (TEMOVATE) 0.05 % Apply 1 Application topically 2 (two) times daily. Patient stated she use a few times a month    [provider]  magnesium oxide (MAG-OX) 400 (240 Mg) MG tablet Take 400 mg by mouth daily. Patient takes 2 tablets daily    [provider]    Allergies: Nickel and Percocet [oxycodone -acetaminophen ]    Review of Systems  Updated Vital Signs BP 114/79   Pulse 75   Temp 98 F (36.7 C)   Resp 18   SpO2 99%   Physical Exam Vitals and nursing note reviewed.  Constitutional:      General: She is not in acute distress.    Appearance: She  is well-developed. She is not diaphoretic.  HENT:     Head: Normocephalic and atraumatic.     Right Ear: External ear normal.     Left Ear: External ear normal.     Nose: Nose normal.     Mouth/Throat:     Mouth: Mucous membranes are moist.   Eyes:     General: No scleral icterus.    Conjunctiva/sclera: Conjunctivae normal.    Cardiovascular:     Rate and Rhythm: Normal rate and regular rhythm.     Heart sounds: Normal heart sounds. No murmur heard.    No friction rub. No gallop.  Pulmonary:     Effort: Pulmonary effort is normal. No respiratory distress.     Breath sounds: Normal breath sounds.  Abdominal:     General: Bowel sounds are normal. There is no distension.     Palpations: Abdomen is soft. There is no mass.     Tenderness: There is no abdominal tenderness. There is no guarding.  Genitourinary:    Comments: Patient with normal female anatomy.  She appears to remain well estrogen needed.  She has areas of variegated appearing pink and then pale tissue on the labia minora and in the vaginal canal.  IUD strings present.  No obvious signs of infection or prolapse  Musculoskeletal:     Cervical back: Normal  range of motion.   Skin:    General: Skin is warm and dry.   Neurological:     Mental Status: She is alert and oriented to person, place, and time.   Psychiatric:        Behavior: Behavior normal.     (all labs ordered are listed, but only abnormal results are displayed) Labs Reviewed  WET PREP, GENITAL  URINALYSIS, W/ REFLEX TO CULTURE (INFECTION SUSPECTED)    EKG: None  Radiology: No results found.   Procedures   Medications Ordered in the ED - No data to display                                  Medical Decision Making Amount and/or Complexity of Data Reviewed Labs: ordered.   Patient here with vaginal irritation.  Differential diagnosis includes lichen sclerosis atrophic vaginitis.,  Yeast vaginitis.  Physical examination reveals no major  abnormalities further workup with gynecology.  Patient appears otherwise appropriate for discharge at this time.  Discussed outpatient follow-up and return precautions.     Final diagnoses:  None    ED Discharge Orders     None          Tama Fails, PA-C 12/13/23 0000

## 2023-12-12 NOTE — Discharge Instructions (Signed)
 Your wet prep and urine showed no evidence infection.  I am given you referrals to 2 clinics they should take your insurance.  Get help right away if you have any new or worsening symptoms including severe pelvic pain, flank pain or fevers.

## 2023-12-12 NOTE — ED Triage Notes (Signed)
 Pt here for pain radiating from urethra to vulva x4-5 days . Worse when lying flat, states it feels like my vagina is going to fall out when I lie down. Treated for uti 2 weeks ago and completed antibiotic course
